# Patient Record
Sex: Female | Born: 1980 | Race: Black or African American | Hispanic: No | Marital: Married | Smoking: Never smoker
Health system: Southern US, Community
[De-identification: ages and names within clinical notes are randomized; demographics above are authoritative.]

## PROBLEM LIST (undated history)

## (undated) DIAGNOSIS — K602 Anal fissure, unspecified: Secondary | ICD-10-CM

## (undated) DIAGNOSIS — K649 Unspecified hemorrhoids: Secondary | ICD-10-CM

## (undated) DIAGNOSIS — I499 Cardiac arrhythmia, unspecified: Secondary | ICD-10-CM

## (undated) HISTORY — PX: NO PAST SURGERIES: SHX2092

## (undated) HISTORY — DX: Cardiac arrhythmia, unspecified: I49.9

## (undated) HISTORY — PX: WISDOM TOOTH EXTRACTION: SHX21

## (undated) HISTORY — PX: POLYPECTOMY: SHX149

---
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
## 2000-04-09 ENCOUNTER — Emergency Department (HOSPITAL_COMMUNITY): Admission: EM | Admit: 2000-04-09 | Discharge: 2000-04-10 | Payer: Self-pay | Admitting: Emergency Medicine

## 2002-07-23 ENCOUNTER — Inpatient Hospital Stay (HOSPITAL_COMMUNITY): Admission: EM | Admit: 2002-07-23 | Discharge: 2002-07-28 | Payer: Self-pay | Admitting: Emergency Medicine

## 2004-01-12 ENCOUNTER — Emergency Department (HOSPITAL_COMMUNITY): Admission: EM | Admit: 2004-01-12 | Discharge: 2004-01-12 | Payer: Self-pay | Admitting: Emergency Medicine

## 2006-01-21 ENCOUNTER — Inpatient Hospital Stay (HOSPITAL_COMMUNITY): Admission: AD | Admit: 2006-01-21 | Discharge: 2006-01-21 | Payer: Self-pay | Admitting: Obstetrics & Gynecology

## 2009-12-06 ENCOUNTER — Emergency Department (HOSPITAL_COMMUNITY): Admission: EM | Admit: 2009-12-06 | Discharge: 2009-12-06 | Payer: Self-pay | Admitting: Emergency Medicine

## 2010-05-26 LAB — URINE MICROSCOPIC-ADD ON

## 2010-05-26 LAB — URINALYSIS, ROUTINE W REFLEX MICROSCOPIC
Bilirubin Urine: NEGATIVE
Hgb urine dipstick: NEGATIVE
Nitrite: NEGATIVE
Specific Gravity, Urine: 1.027 (ref 1.005–1.030)
pH: 7 (ref 5.0–8.0)

## 2010-05-26 LAB — PREGNANCY, URINE: Preg Test, Ur: NEGATIVE

## 2010-05-26 LAB — URINE CULTURE

## 2010-07-29 NOTE — H&P (Signed)
NAME:  Meghan Jackson, Meghan Jackson NO.:  [id number]   MEDICAL RECORD NO.:  [id number]                   PATIENT TYPE:  INP   LOCATION:  1843                                 FACILITY:  [facility]   PHYSICIAN:  Ollen Gross. Vernell Morgans, M.D.              DATE OF BIRTH:  07/19/1980   DATE OF ADMISSION:  07/23/2002  DATE OF DISCHARGE:                                HISTORY & PHYSICAL   CHIEF COMPLAINT:  Rectal pain.   [name] is a 30 year old black female who has a history of anal fissure  about a year ago.  She had been doing well since then, until yesterday when  she developed acute onset of pain and swelling in her perirectal area.  She  noticed this after having a bowel movement.  She does not remember straining  excessively with her bowel movement.  Her bowels seem to move on a regular  basis.   REVIEW OF SYSTEMS:  She denies any nausea, vomiting, fevers, chills, chest  pain, shortness of breath, diarrhea, or dysuria.  The rest of her review of  systems is unremarkable.   PAST MEDICAL HISTORY:  Significant for anal fissure.   PAST SURGICAL HISTORY:  None.   MEDICATIONS:  Include:  Vicodin and penicillin.   ALLERGIES:  NO KNOWN DRUG ALLERGIES.   SOCIAL HISTORY:  She denies use of tobacco or tobacco products.   FAMILY HISTORY:  Noncontributory.   PHYSICAL EXAMINATION:  VITAL SIGNS:  Temperature 100.2.  Blood pressure  119/82.  Pulse 85.  GENERAL:  She is a well-developed well-nourished young bright female in no  acute distress.  SKIN:  Warm and dry with no jaundice.  EYES:  Extraocular muscles are intact.  Pupils equal, round, and reactive to  light.  Sclerae nonicteric.  LUNGS:  Clear bilaterally with no use of accessory respiratory muscles.  HEART: Regular rate and rhythm with impulse in the left chest.  ABDOMEN:  Soft and nontender with no palpable mass or hepatosplenomegaly.  EXTREMITIES:  No clubbing, cyanosis, or edema.  PSYCHOLOGICAL:  Alert and  oriented x 3 with no evidence of anxiety or  depression, but she is very painful and tearful.  RECTAL:  She had circumferentially prolapsed internal hemorrhoids with no  necrosis of the overlying tissue.  These are easily reducible with finger  palpation.  I cannot palpate any mass.   ASSESSMENT/PLAN:  This is a 30 year old black female with prolapsed internal  hemorrhoids that are reducible with no overlying necrosis.  We will plan to  admit her for bedrest and continuous ice packs and pain control.  If we  can improve her swelling, she may be able to go home on stool softeners and  may be a candidate for a hemorrhoidectomy at a later date and possibly a PPH  hemorrhoidectomy.  If she does not improve, then she may require a more  formal hemorrhoidectomy  during this admission.                                               Ollen Gross. Vernell Morgans, M.D.    PST/MEDQ  D:  07/23/2002  T:  07/24/2002  Job:  [id number]

## 2010-07-29 NOTE — Discharge Summary (Signed)
   NAME:  BLIA, TOTMAN NO.:  [id number]   MEDICAL RECORD NO.:  [id number]                   PATIENT TYPE:  INP   LOCATION:  5528                                 FACILITY:  [facility]   PHYSICIAN:  Ollen Gross. Vernell Morgans, M.D.              DATE OF BIRTH:  Sep 22, 1980   DATE OF ADMISSION:  07/23/2002  DATE OF DISCHARGE:  07/28/2002                                 DISCHARGE SUMMARY   HOSPITAL COURSE:  Briefly, [name] is a 30 year old black female who was  admitted with severe circumferential prolapsed internal hemorrhoids with no  evidence of skin necrosis.  She was admitted for pain control and bedrest  and continuous ice packs to rectum.  She made slow progress over the ensuing  several days but by hospital day #5 her hemorrhoids were much smaller, more  reducible.  Her pain was improved and she was ready for discharge home.   DISCHARGE MEDICATIONS:  1. Stools softeners.  2. Pain medicines as needed.   DIET:  High-fiber diet.   ACTIVITY:  No heavy lifting.   DISCHARGE INSTRUCTIONS:  She is to continue the ice packs to her perirectal  area alternating with Tucks pads and she will follow up in one week with Dr.  Carolynne Edouard.   FINAL DIAGNOSIS:  Severe prolapsed internal hemorrhoids and she is  discharged home.                                               Ollen Gross. Vernell Morgans, M.D.    PST/MEDQ  D:  09/02/2002  T:  09/03/2002  Job:  [id number]

## 2011-11-09 ENCOUNTER — Emergency Department (HOSPITAL_COMMUNITY)
Admission: EM | Admit: 2011-11-09 | Discharge: 2011-11-09 | Disposition: A | Discharge status: Home / Self Care | Payer: No Typology Code available for payment source | Attending: Emergency Medicine | Admitting: Emergency Medicine

## 2011-11-09 ENCOUNTER — Encounter (HOSPITAL_COMMUNITY): Payer: Self-pay | Admitting: *Deleted

## 2011-11-09 DIAGNOSIS — K645 Perianal venous thrombosis: Secondary | ICD-10-CM | POA: Insufficient documentation

## 2011-11-09 DIAGNOSIS — K644 Residual hemorrhoidal skin tags: Secondary | ICD-10-CM

## 2011-11-09 HISTORY — DX: Anal fissure, unspecified: K60.2

## 2011-11-09 HISTORY — DX: Unspecified hemorrhoids: K64.9

## 2011-11-09 MED ORDER — HYDROCORTISONE 2.5 % RE CREA: TOPICAL_CREAM | RECTAL | Status: AC

## 2011-11-09 MED ORDER — HYDROCODONE-ACETAMINOPHEN 5-325 MG PO TABS: 1.0000 | ORAL_TABLET | Freq: Four times a day (QID) | ORAL | Status: AC | PRN

## 2011-11-09 MED ORDER — POLYETHYLENE GLYCOL 3350 17 G PO PACK: 17.0000 g | PACK | Freq: Every day | ORAL | Status: AC

## 2011-11-09 MED ORDER — LIDOCAINE 5 % EX OINT
TOPICAL_OINTMENT | Freq: Two times a day (BID) | CUTANEOUS | Status: DC | PRN
  Administered 2011-11-09: 17:00:00 via TOPICAL
  Filled 2011-11-09: qty 35.44

## 2011-11-09 NOTE — ED Notes (Signed)
Pt reports increasing discomfort to hemorrhoids. Pt reports no relief at home with home remedies.

## 2011-11-09 NOTE — ED Notes (Signed)
Lido ordered from pharmacy

## 2011-11-09 NOTE — ED Notes (Signed)
Denies any blood to rectum.

## 2011-11-09 NOTE — ED Provider Notes (Signed)
History     CSN: [id number]  Arrival date & time 11/09/11  1314   First MD Initiated Contact with Patient 11/09/11 1608      Chief Complaint  Patient presents with  . Hemorrhoids    (Consider location/radiation/quality/duration/timing/severity/associated sxs/prior treatment) HPI Comments: Patient with a history of hemorrhoids and anal fissures presents emergency department with chief complaint of hemorrhoid.  She reports current onset of hemorrhoid began approximately a week and half ago and has been gradually worsening.  Pain severity 10/10.  She denies any hematemesis, melena, gross rectal bleeding, purulent drainage, pruritus, fever, night sweats, or chills. Associated s/s include reports increased hemorrhoid size, fluctuance and pain with defecation.  Pt states she has tied epson soaks w no relief.   The history is provided by the patient.    Past Medical History  Diagnosis Date  . Hemorrhoids   . Anal fissure     History reviewed. No pertinent past surgical history.  History reviewed. No pertinent family history.  History  Substance Use Topics  . Smoking status: Never Smoker   . Smokeless tobacco: Not on file  . Alcohol Use: Yes     occ    OB History    Grav Para Term Preterm Abortions TAB SAB Ect Mult Living                  Review of Systems  Constitutional: Negative for fever, diaphoresis and activity change.  HENT: Negative for congestion and neck pain.   Respiratory: Negative for cough.   Gastrointestinal: Positive for rectal pain. Negative for nausea, vomiting, constipation, blood in stool and anal bleeding.  Genitourinary: Negative for dysuria.  Musculoskeletal: Negative for myalgias.  Skin: Negative for color change and wound.  Neurological: Negative for headaches.  All other systems reviewed and are negative.    Allergies  Review of patient's allergies indicates no known allergies.  Home Medications   Current Outpatient Rx  Name Route Sig  Dispense Refill  . ASPIRIN 325 MG PO TABS Oral Take 325 mg by mouth daily as needed. For pain    . PHENYLEPH-SHARK LIV OIL-MO-PET 0.25-3-14-71.9 % RE OINT Rectal Place 1 application rectally 2 (two) times daily as needed. For hemorrhoids    . PE-SHARK LIVER OIL-COCOA BUTTR 0.25-3-85.5 % RE SUPP Rectal Place 1 suppository rectally 4 (four) times daily as needed. For hemorrhoids      BP 106/72  Pulse 86  Temp 97.9 F (36.6 C) (Oral)  Resp 16  SpO2 98%  Physical Exam  Nursing note and vitals reviewed. Constitutional: She is oriented to person, place, and time. She appears well-developed and well-nourished. No distress.  HENT:  Head: Normocephalic and atraumatic.  Eyes: Conjunctivae and EOM are normal.  Neck: Normal range of motion.  Pulmonary/Chest: Effort normal.  Genitourinary:       Chaperone was present. Patient with significant pain around the rectal area. There are no external fissures noted. 3 large external hemorrhoids seen. 2 appear thrombosed and 1 fluctuant, likely prolapsed.  There is no gross blood.      Musculoskeletal: Normal range of motion.  Neurological: She is alert and oriented to person, place, and time.  Skin: Skin is warm and dry. No rash noted. She is not diaphoretic.  Psychiatric: She has a normal mood and affect. Her behavior is normal.    ED Course  Procedures (including critical care time)  Labs Reviewed - No data to display No results found.   No diagnosis found.  MDM  Hemorrhoids  Pt to ER e external hemorrhoids. Home care discussed including sitz baths 15 min TID. Dc w lidocaine ointment, Anusol, stool softener and recommendation for surgery f-u. Pt w normal VS and in NAD prior to dc.         Jaci Carrel, [place] 11/09/11 1639

## 2011-11-09 NOTE — ED Provider Notes (Signed)
Medical screening examination/treatment/procedure(s) were performed by non-physician practitioner and as supervising physician I was immediately available for consultation/collaboration. Devoria Albe, MD, Armando Gang   Ward Givens, MD 11/09/11 2016

## 2011-11-27 ENCOUNTER — Encounter (INDEPENDENT_AMBULATORY_CARE_PROVIDER_SITE_OTHER): Payer: Self-pay | Admitting: General Surgery

## 2011-12-14 ENCOUNTER — Encounter (INDEPENDENT_AMBULATORY_CARE_PROVIDER_SITE_OTHER): Payer: Self-pay | Admitting: General Surgery

## 2012-01-09 ENCOUNTER — Encounter (INDEPENDENT_AMBULATORY_CARE_PROVIDER_SITE_OTHER): Payer: Self-pay | Admitting: General Surgery

## 2012-01-22 ENCOUNTER — Encounter (INDEPENDENT_AMBULATORY_CARE_PROVIDER_SITE_OTHER): Payer: Self-pay | Admitting: General Surgery

## 2012-06-06 ENCOUNTER — Other Ambulatory Visit (HOSPITAL_COMMUNITY): Payer: Self-pay | Admitting: Obstetrics & Gynecology

## 2012-06-06 DIAGNOSIS — N979 Female infertility, unspecified: Secondary | ICD-10-CM

## 2012-06-13 ENCOUNTER — Ambulatory Visit (HOSPITAL_COMMUNITY)
Admission: RE | Admit: 2012-06-13 | Discharge: 2012-06-13 | Disposition: A | Discharge status: Home / Self Care | Payer: BC Managed Care – PPO | Source: Ambulatory Visit | Attending: Obstetrics & Gynecology | Admitting: Obstetrics & Gynecology

## 2012-06-13 DIAGNOSIS — N979 Female infertility, unspecified: Secondary | ICD-10-CM | POA: Insufficient documentation

## 2012-06-13 MED ORDER — IOHEXOL 300 MG/ML  SOLN
20.0000 mL | Freq: Once | INTRAMUSCULAR | Status: AC | PRN
  Administered 2012-06-13: 5 mL

## 2013-02-21 ENCOUNTER — Inpatient Hospital Stay (HOSPITAL_COMMUNITY)
Admission: EM | Admit: 2013-02-21 | Discharge: 2013-02-24 | DRG: 872 | Disposition: A | Discharge status: Home / Self Care | Payer: BC Managed Care – PPO | Attending: Internal Medicine | Admitting: Internal Medicine

## 2013-02-21 ENCOUNTER — Encounter (HOSPITAL_COMMUNITY): Payer: Self-pay | Admitting: Emergency Medicine

## 2013-02-21 DIAGNOSIS — L0231 Cutaneous abscess of buttock: Secondary | ICD-10-CM | POA: Diagnosis present

## 2013-02-21 DIAGNOSIS — I959 Hypotension, unspecified: Secondary | ICD-10-CM

## 2013-02-21 DIAGNOSIS — L0501 Pilonidal cyst with abscess: Secondary | ICD-10-CM

## 2013-02-21 DIAGNOSIS — E871 Hypo-osmolality and hyponatremia: Secondary | ICD-10-CM | POA: Diagnosis present

## 2013-02-21 DIAGNOSIS — A419 Sepsis, unspecified organism: Principal | ICD-10-CM | POA: Diagnosis present

## 2013-02-21 DIAGNOSIS — D72829 Elevated white blood cell count, unspecified: Secondary | ICD-10-CM | POA: Diagnosis present

## 2013-02-21 NOTE — ED Notes (Signed)
Pt. reports abscess at buttocks onset Monday this week with no drainage . Fever with chills. Pt. took Ibuprofen prior to arrival .

## 2013-02-22 ENCOUNTER — Emergency Department (HOSPITAL_COMMUNITY): Payer: BC Managed Care – PPO

## 2013-02-22 DIAGNOSIS — L03317 Cellulitis of buttock: Secondary | ICD-10-CM | POA: Diagnosis present

## 2013-02-22 DIAGNOSIS — I959 Hypotension, unspecified: Secondary | ICD-10-CM

## 2013-02-22 DIAGNOSIS — A419 Sepsis, unspecified organism: Principal | ICD-10-CM

## 2013-02-22 DIAGNOSIS — E871 Hypo-osmolality and hyponatremia: Secondary | ICD-10-CM

## 2013-02-22 DIAGNOSIS — D72829 Elevated white blood cell count, unspecified: Secondary | ICD-10-CM

## 2013-02-22 DIAGNOSIS — L0231 Cutaneous abscess of buttock: Secondary | ICD-10-CM

## 2013-02-22 LAB — URINALYSIS, ROUTINE W REFLEX MICROSCOPIC
Bilirubin Urine: NEGATIVE
Glucose, UA: NEGATIVE mg/dL
Ketones, ur: 15 mg/dL — AB
Nitrite: NEGATIVE
Specific Gravity, Urine: 1.029 (ref 1.005–1.030)
pH: 6 (ref 5.0–8.0)

## 2013-02-22 LAB — CBC WITH DIFFERENTIAL/PLATELET
Basophils Relative: 0 % (ref 0–1)
HCT: 37.7 % (ref 36.0–46.0)
Hemoglobin: 12.4 g/dL (ref 12.0–15.0)
Lymphocytes Relative: 22 % (ref 12–46)
Lymphs Abs: 3 10*3/uL (ref 0.7–4.0)
MCHC: 32.9 g/dL (ref 30.0–36.0)
Monocytes Absolute: 1.2 10*3/uL — ABNORMAL HIGH (ref 0.1–1.0)
Monocytes Relative: 8 % (ref 3–12)
Neutro Abs: 9.6 10*3/uL — ABNORMAL HIGH (ref 1.7–7.7)
Neutrophils Relative %: 69 % (ref 43–77)
RBC: 4.36 MIL/uL (ref 3.87–5.11)

## 2013-02-22 LAB — POCT PREGNANCY, URINE: Preg Test, Ur: NEGATIVE

## 2013-02-22 LAB — BASIC METABOLIC PANEL
BUN: 12 mg/dL (ref 6–23)
CO2: 24 mEq/L (ref 19–32)
Chloride: 100 mEq/L (ref 96–112)
Creatinine, Ser: 0.89 mg/dL (ref 0.50–1.10)
GFR calc Af Amer: >90 mL/min (ref >90)
Glucose, Bld: 106 mg/dL — ABNORMAL HIGH (ref 70–99)
Potassium: 4.1 mEq/L (ref 3.5–5.1)

## 2013-02-22 LAB — URINE MICROSCOPIC-ADD ON

## 2013-02-22 MED ORDER — SODIUM CHLORIDE 0.9 % IV BOLUS (SEPSIS)
1000.0000 mL | Freq: Once | INTRAVENOUS | Status: AC
  Administered 2013-02-22: 1000 mL via INTRAVENOUS

## 2013-02-22 MED ORDER — ZOLPIDEM TARTRATE 5 MG PO TABS: 5.0000 mg | ORAL_TABLET | Freq: Every evening | ORAL | Status: DC | PRN

## 2013-02-22 MED ORDER — SODIUM CHLORIDE 0.9 % IV SOLN
1000.0000 mL | INTRAVENOUS | Status: DC
  Administered 2013-02-22: 1000 mL via INTRAVENOUS

## 2013-02-22 MED ORDER — ONDANSETRON HCL 4 MG/2ML IJ SOLN
4.0000 mg | Freq: Once | INTRAMUSCULAR | Status: AC
  Administered 2013-02-22: 4 mg via INTRAVENOUS
  Filled 2013-02-22: qty 2

## 2013-02-22 MED ORDER — HYDROMORPHONE HCL PF 1 MG/ML IJ SOLN: 0.5000 mg | INTRAMUSCULAR | Status: DC | PRN

## 2013-02-22 MED ORDER — SODIUM CHLORIDE 0.9 % IV SOLN
1000.0000 mL | Freq: Once | INTRAVENOUS | Status: AC
  Administered 2013-02-22: 1000 mL via INTRAVENOUS

## 2013-02-22 MED ORDER — MORPHINE SULFATE 4 MG/ML IJ SOLN
4.0000 mg | Freq: Once | INTRAMUSCULAR | Status: AC
  Administered 2013-02-22: 4 mg via INTRAVENOUS
  Filled 2013-02-22: qty 1

## 2013-02-22 MED ORDER — ONDANSETRON HCL 4 MG/2ML IJ SOLN: 4.0000 mg | Freq: Four times a day (QID) | INTRAMUSCULAR | Status: DC | PRN

## 2013-02-22 MED ORDER — PIPERACILLIN-TAZOBACTAM 3.375 G IVPB
3.3750 g | Freq: Once | INTRAVENOUS | Status: AC
Start: 2013-02-22 — End: 2013-02-22
  Administered 2013-02-22: 3.375 g via INTRAVENOUS
  Filled 2013-02-22: qty 50

## 2013-02-22 MED ORDER — OXYCODONE HCL 5 MG PO TABS
5.0000 mg | ORAL_TABLET | ORAL | Status: DC | PRN
  Administered 2013-02-22 – 2013-02-24 (×4): 5 mg via ORAL
  Filled 2013-02-22 (×4): qty 1

## 2013-02-22 MED ORDER — VANCOMYCIN HCL IN DEXTROSE 1-5 GM/200ML-% IV SOLN
1000.0000 mg | Freq: Three times a day (TID) | INTRAVENOUS | Status: DC
  Administered 2013-02-22 – 2013-02-24 (×7): 1000 mg via INTRAVENOUS
  Filled 2013-02-22 (×8): qty 200

## 2013-02-22 MED ORDER — ACETAMINOPHEN 325 MG PO TABS
650.0000 mg | ORAL_TABLET | ORAL | Status: AC
  Administered 2013-02-22: 650 mg via ORAL
  Filled 2013-02-22: qty 2

## 2013-02-22 MED ORDER — ALUM & MAG HYDROXIDE-SIMETH 200-200-20 MG/5ML PO SUSP: 30.0000 mL | Freq: Four times a day (QID) | ORAL | Status: DC | PRN

## 2013-02-22 MED ORDER — ACETAMINOPHEN 325 MG PO TABS: 650.0000 mg | ORAL_TABLET | Freq: Four times a day (QID) | ORAL | Status: DC | PRN

## 2013-02-22 MED ORDER — ENOXAPARIN SODIUM 40 MG/0.4ML ~~LOC~~ SOLN
40.0000 mg | Freq: Every day | SUBCUTANEOUS | Status: DC
  Administered 2013-02-22 – 2013-02-24 (×3): 40 mg via SUBCUTANEOUS
  Filled 2013-02-22 (×3): qty 0.4

## 2013-02-22 MED ORDER — ONDANSETRON HCL 4 MG PO TABS: 4.0000 mg | ORAL_TABLET | Freq: Four times a day (QID) | ORAL | Status: DC | PRN

## 2013-02-22 MED ORDER — VANCOMYCIN HCL IN DEXTROSE 1-5 GM/200ML-% IV SOLN
1000.0000 mg | Freq: Once | INTRAVENOUS | Status: AC
  Administered 2013-02-22: 1000 mg via INTRAVENOUS
  Filled 2013-02-22: qty 200

## 2013-02-22 MED ORDER — ACETAMINOPHEN 650 MG RE SUPP: 650.0000 mg | Freq: Four times a day (QID) | RECTAL | Status: DC | PRN

## 2013-02-22 MED ORDER — SODIUM CHLORIDE 0.9 % IV SOLN
INTRAVENOUS | Status: DC
  Administered 2013-02-22 – 2013-02-23 (×4): via INTRAVENOUS

## 2013-02-22 MED ORDER — PIPERACILLIN-TAZOBACTAM 3.375 G IVPB
3.3750 g | Freq: Three times a day (TID) | INTRAVENOUS | Status: DC
  Administered 2013-02-22 – 2013-02-24 (×6): 3.375 g via INTRAVENOUS
  Filled 2013-02-22 (×9): qty 50

## 2013-02-22 NOTE — ED Notes (Signed)
Pt reports boil inside right buttock since Monday. Reports fever, chills, pain. Took ibuprofen yesterday for fever.

## 2013-02-22 NOTE — Progress Notes (Signed)
New Admission Note: Late Entry   Arrival Method: Via stretcher from the ED with EMT Mental Orientation: Alert and oriented Telemetry: Box 15 Assessment: Completed Skin: Right buttock abscess, packed with iodoform (ED MD) IV: Clean, dry and intact. Infusing NSL and ABT Pain: Patient stated the she is comfortable at the moment  Tubes: None Safety Measures: Safety Fall Prevention Plan has been given, discussed and signed Admission: Completed [address] Orientation: Patient has been orientated to the room, unit and staff.  Family: Husband at the bedside  Orders have been reviewed and implemented. Will continue to monitor the patient. Call light has been placed within reach and bed alarm has been activated.   [employer] BSN, RN  Phone number: [phone]

## 2013-02-22 NOTE — ED Provider Notes (Signed)
CSN: [id number]     Arrival date & time 02/21/13  2119 History   First MD Initiated Contact with Patient 02/22/13 0018     Chief Complaint  Patient presents with  . Abscess   HPI  History provided by the patient. Patient is a 32 year old female who presents with complaints of boil to her sacral area with pain and swelling. Patient reports first having a small boil she felt on Monday. She did not have any bleeding or drainage from the area. She does report having past history of similar symptoms but states after a few days of warm compresses these usually resolve on their own. She has no prior history of requiring I&D. Her symptoms however worsened through the week. Yesterday patient began to also have symptoms of fever and chills. She states she was trying to hold off having further evaluation until the day but symptoms became much worse tonight and she came for further evaluation and treatment. Patient did take ibuprofen prior to arrival which only helps slightly with her pain but did seem to help with her fever symptoms. She denies having any other symptoms recently. No cough or congestion symptoms. No urinary complaints. No other aggravating or alleviating factors. No other associated symptoms.     Past Medical History  Diagnosis Date  . Hemorrhoids   . Anal fissure    History reviewed. No pertinent past surgical history. No family history on file. History  Substance Use Topics  . Smoking status: Never Smoker   . Smokeless tobacco: Not on file  . Alcohol Use: Yes     Comment: occ   OB History   Grav Para Term Preterm Abortions TAB SAB Ect Mult Living                 Review of Systems  Constitutional: Positive for fever and chills.  Respiratory: Negative for cough and shortness of breath.   Cardiovascular: Negative for chest pain.  Gastrointestinal: Negative for nausea, vomiting, abdominal pain, diarrhea and constipation.  Genitourinary: Negative for dysuria, frequency,  hematuria and flank pain.  All other systems reviewed and are negative.    Allergies  Latex  Home Medications   Current Outpatient Rx  Name  Route  Sig  Dispense  Refill  . ibuprofen (ADVIL,MOTRIN) 200 MG tablet   Oral   Take 400 mg by mouth every 6 (six) hours as needed.          BP 94/52  Pulse 104  Temp(Src) 99.7 F (37.6 C) (Oral)  Resp 14  Ht 5\' 4"  (1.626 m)  Wt 176 lb (79.833 kg)  BMI 30.20 kg/m2  SpO2 100% Physical Exam  Nursing note and vitals reviewed. Constitutional: She is oriented to person, place, and time. She appears well-developed and well-nourished. No distress.  HENT:  Head: Normocephalic.  Neck: Normal range of motion. Neck supple.  No meningeal signs  Cardiovascular: Regular rhythm.  Tachycardia present.   Pulmonary/Chest: Effort normal and breath sounds normal. No respiratory distress. She has no wheezes. She has no rales.  Abdominal: Soft. There is no tenderness. There is no rebound and no guarding.  Genitourinary:  Large area of erythema and induration to the pilonidal area extending into the left gluteus. Area sniffily tender to palpation. No pointing or head. No bleeding or drainage.  Musculoskeletal: Normal range of motion. She exhibits no edema and no tenderness.  Neurological: She is alert and oriented to person, place, and time.  Skin: Skin is warm. No rash noted.  She is diaphoretic.  Psychiatric: She has a normal mood and affect. Her behavior is normal.    ED Course  Procedures   DIAGNOSTIC STUDIES: Oxygen Saturation is 100% on room air.  COORDINATION OF CARE:  Nursing notes reviewed. Vital signs reviewed. Initial pt interview and examination performed.   1:15AM Discussed work up plan with pt at bedside, which includes labs, blood cultures and I&D. Pt agrees with plan.  1:50AM Pt now developing hypotension.  80/45 despite start of iv fluids.  Code sepsis called.  Vanc and zosyn ordered.  Pt discussed with attending who  agrees.   5:40AM Spoke with Dr. Lovell Sheehan with [facility].  She will see pt and admit to tele bed team 10.   INCISION AND DRAINAGE Performed by: Angus Seller Consent: Verbal consent obtained. Risks and benefits: risks, benefits and alternatives were discussed Type: abscess  Body area: Pilonidal  Anesthesia: local infiltration  Incision was made with a scalpel.  Local anesthetic: lidocaine 2% with epinephrine  Anesthetic total: 8 ml  Complexity: complex Blunt dissection to break up loculations  Drainage: purulent  Drainage amount: Large   Packing material: 1/4 in iodoform gauze  Patient tolerance: Patient tolerated the procedure well with no immediate complications.      Treatment plan initiated: Medications  sodium chloride 0.9 % bolus 1,000 mL (not administered)  vancomycin (VANCOCIN) IVPB 1000 mg/200 mL premix (not administered)  piperacillin-tazobactam (ZOSYN) IVPB 3.375 g (not administered)  0.9 %  sodium chloride infusion (not administered)    Followed by  0.9 %  sodium chloride infusion (not administered)    Followed by  0.9 %  sodium chloride infusion (not administered)  acetaminophen (TYLENOL) tablet 650 mg (650 mg Oral Given 02/22/13 0017)  sodium chloride 0.9 % bolus 1,000 mL (1,000 mLs Intravenous New Bag/Given 02/22/13 0125)     Results for orders placed during the hospital encounter of 02/21/13  CBC WITH DIFFERENTIAL      Result Value Range   WBC 13.9 (*) 4.0 - 10.5 K/uL   RBC 4.36  3.87 - 5.11 MIL/uL   Hemoglobin 12.4  12.0 - 15.0 g/dL   HCT 47.8  29.5 - 62.1 %   MCV 86.5  78.0 - 100.0 fL   MCH 28.4  26.0 - 34.0 pg   MCHC 32.9  30.0 - 36.0 g/dL   RDW 30.8  65.7 - 84.6 %   Platelets 232  150 - 400 K/uL   Neutrophils Relative % 69  43 - 77 %   Neutro Abs 9.6 (*) 1.7 - 7.7 K/uL   Lymphocytes Relative 22  12 - 46 %   Lymphs Abs 3.0  0.7 - 4.0 K/uL   Monocytes Relative 8  3 - 12 %   Monocytes Absolute 1.2 (*) 0.1 - 1.0 K/uL    Eosinophils Relative 1  0 - 5 %   Eosinophils Absolute 0.1  0.0 - 0.7 K/uL   Basophils Relative 0  0 - 1 %   Basophils Absolute 0.0  0.0 - 0.1 K/uL  BASIC METABOLIC PANEL      Result Value Range   Sodium 132 (*) 135 - 145 mEq/L   Potassium 4.1  3.5 - 5.1 mEq/L   Chloride 100  96 - 112 mEq/L   CO2 24  19 - 32 mEq/L   Glucose, Bld 106 (*) 70 - 99 mg/dL   BUN 12  6 - 23 mg/dL   Creatinine, Ser 9.62  0.50 - 1.10 mg/dL  Calcium 9.1  8.4 - 10.5 mg/dL   GFR calc non Af Amer 84 (*) >90 mL/min   GFR calc Af Amer >90  >90 mL/min  URINALYSIS, ROUTINE W REFLEX MICROSCOPIC      Result Value Range   Color, Urine YELLOW  YELLOW   APPearance CLEAR  CLEAR   Specific Gravity, Urine 1.029  1.005 - 1.030   pH 6.0  5.0 - 8.0   Glucose, UA NEGATIVE  NEGATIVE mg/dL   Hgb urine dipstick TRACE (*) NEGATIVE   Bilirubin Urine NEGATIVE  NEGATIVE   Ketones, ur 15 (*) NEGATIVE mg/dL   Protein, ur NEGATIVE  NEGATIVE mg/dL   Urobilinogen, UA 1.0  0.0 - 1.0 mg/dL   Nitrite NEGATIVE  NEGATIVE   Leukocytes, UA NEGATIVE  NEGATIVE  HCG, QUANTITATIVE, PREGNANCY      Result Value Range   hCG, Beta Chain, Quant, S <1  <5 mIU/mL  URINE MICROSCOPIC-ADD ON      Result Value Range   Squamous Epithelial / LPF FEW (*) RARE   RBC / HPF 3-6  <3 RBC/hpf   Bacteria, UA RARE  RARE  CG4 I-STAT (LACTIC ACID)      Result Value Range   Lactic Acid, Venous 0.85  0.5 - 2.2 mmol/L  POCT PREGNANCY, URINE      Result Value Range   Preg Test, Ur NEGATIVE  NEGATIVE   Dg Chest Portable 1 View  02/22/2013   CLINICAL DATA:  Abscess  EXAM: PORTABLE CHEST - 1 VIEW  COMPARISON:  None.  FINDINGS: The heart size and mediastinal contours are within normal limits. Both lungs are clear. The visualized skeletal structures are unremarkable.  IMPRESSION: No acute cardiopulmonary process.   Electronically Signed   By: Rise Mu M.D.   On: 02/22/2013 03:39       MDM   1. Sepsis   2. Pilonidal abscess        Angus Seller, PA-C 02/22/13 [phone]

## 2013-02-22 NOTE — H&P (Signed)
[facility] History and Physical  MISCHELLE REEG ZOX:[id number] DOB: 05-31-1980 DOA: 02/21/2013  Referring physician: EDP PCP: Robley Fries, MD  Specialists:   Chief Complaint: Fever   HPI: Meghan Jackson is a 32 y.o. female who presents to the ED with complaints of pain and increased redness of the left buttock area x 5 days and development of fevers and chills over the past  24 hours.   She was seen and evaluated in the ED and was found to have a Left Buttock /Pilonidal Abscess and an I+D was performed.  Blood Cultures and a Wound Culture were sent and she was placed on IV Vancomycin and Zosyn.  She had hypotension with blood pressures in the 80's which responded to administration of IVFs,  She was referred for medical admission.       Review of Systems: The patient denies anorexia, headaches, weight loss, vision loss, diplopia, dizziness, decreased hearing, rhinitis, hoarseness, chest pain, syncope, dyspnea on exertion, peripheral edema, balance deficits, cough, hemoptysis, abdominal pain, nausea, vomiting, diarrhea, constipation, hematemesis, melena, hematochezia, severe indigestion/heartburn, dysuria, hematuria, incontinence, muscle weakness, transient blindness, difficulty walking, depression, unusual weight change, abnormal bleeding, enlarged lymph nodes, angioedema, and breast masses.    Past Medical History  Diagnosis Date  . Hemorrhoids   . Anal fissure     History reviewed. No pertinent past surgical history.      Prior to Admission medications   Medication Sig Start Date End Date Taking? Authorizing Provider  ibuprofen (ADVIL,MOTRIN) 200 MG tablet Take 400 mg by mouth every 6 (six) hours as needed.   Yes Historical Provider, MD    Allergies  Allergen Reactions  . Latex Itching and Other (See Comments)    dryness    Social History:  reports that she has never smoked. She does not have any smokeless tobacco history on file. She reports that she drinks  alcohol. Her drug history is not on file.     Family History:     Maternal Aunt with Cancer (Unknown type)   Physical Exam:  GEN:  Pleasant Obese 32 y.o. African American female  examined  and in no acute distress; cooperative with exam Filed Vitals:   02/22/13 0530 02/22/13 0545 02/22/13 0600 02/22/13 0615  BP: 102/59 104/45 103/54 102/60  Pulse:   103 108  Temp:      TempSrc:      Resp:   18   Height:      Weight:      SpO2:   100% 100%   Blood pressure 102/60, pulse 108, temperature 99.7 F (37.6 C), temperature source Oral, resp. rate 18, height 5\' 4"  (1.626 m), weight 79.833 kg (176 lb), SpO2 100.00%. PSYCH: SHe is alert and oriented x4; does not appear anxious does not appear depressed; affect is normal HEENT: Normocephalic and Atraumatic, Mucous membranes pink; PERRLA; EOM intact; Fundi:  Benign;  No scleral icterus, Nares: Patent, Oropharynx: Clear, Fair Dentition, Neck:  FROM, no cervical lymphadenopathy nor thyromegaly or carotid bruit; no JVD; Breasts:: Not examined CHEST WALL: No tenderness CHEST: Normal respiration, clear to auscultation bilaterally HEART: Regular rate and rhythm; no murmurs rubs or gallops BACK: No kyphosis or scoliosis; no CVA tenderness ABDOMEN: Positive Bowel Sounds, Obese, soft non-tender; no masses, no organomegaly, no pannus; no intertriginous candida. Rectal Exam: Not done EXTREMITIES: No cyanosis, clubbing or edema; no ulcerations. Genitalia: not examined PULSES: 2+ and symmetric SKIN: Normal hydration no rash or ulceration CNS: Cranial nerves 2-12 grossly intact no focal  neurologic deficit    Labs on Admission:  Basic Metabolic Panel:  Recent Labs Lab 02/22/13 0029  NA 132*  K 4.1  CL 100  CO2 24  GLUCOSE 106*  BUN 12  CREATININE 0.89  CALCIUM 9.1   Liver Function Tests: No results found for this basename: AST, ALT, ALKPHOS, BILITOT, PROT, ALBUMIN,  in the last 168 hours No results found for this basename: LIPASE,  AMYLASE,  in the last 168 hours No results found for this basename: AMMONIA,  in the last 168 hours CBC:  Recent Labs Lab 02/22/13 0029  WBC 13.9*  NEUTROABS 9.6*  HGB 12.4  HCT 37.7  MCV 86.5  PLT 232   Cardiac Enzymes: No results found for this basename: CKTOTAL, CKMB, CKMBINDEX, TROPONINI,  in the last 168 hours  BNP (last 3 results) No results found for this basename: PROBNP,  in the last 8760 hours CBG: No results found for this basename: GLUCAP,  in the last 168 hours  Radiological Exams on Admission: Dg Chest Portable 1 View  02/22/2013   CLINICAL DATA:  Abscess  EXAM: PORTABLE CHEST - 1 VIEW  COMPARISON:  None.  FINDINGS: The heart size and mediastinal contours are within normal limits. Both lungs are clear. The visualized skeletal structures are unremarkable.  IMPRESSION: No acute cardiopulmonary process.   Electronically Signed   By: Rise Mu M.D.   On: 02/22/2013 03:39       Assessment/Plan Principal Problem:   Sepsis Active Problems:   Cellulitis and abscess of buttock   Hypotension   Hyponatremia   Leukocytosis     1.  Sepsis- due to Cellulitis-  Blood Cultures sent, and placed onIV Vancomycin and Zosyn, and IVFs.    2.  Cellulitis of Left Buttock- S/P I+D and packing of wound.  Wound care daily and PRN.    3.  Hypotension due to #1-  IVFs for Fluid Resuscitation, and IV ABx.    4.  Hyponatremia-  IVFs with NSS, monitor trend of Na+ levels  5.  Leukocytosis- due to #1 and #2, Monitor Trend.     6.  DVT prophylaxis with Lovenox.        Code Status:  FULL CODE  Family Communication:    Husband at Bedside Disposition Plan:       Inpatient  Time spent:  47 Minutes  Ron Parker [facility] Pager [phone]  If 7PM-7AM, please contact night-coverage [email or web address] Password [facility] 02/22/2013, 6:37 AM

## 2013-02-22 NOTE — Progress Notes (Signed)
ANTIBIOTIC CONSULT NOTE - INITIAL  Pharmacy Consult for Vancomycin Indication: rule out sepsis/cellulitis of left buttock  Allergies  Allergen Reactions  . Latex Itching and Other (See Comments)    dryness    Patient Measurements: Height: 5\' 4"  (162.6 cm) Weight: 178 lb 1 oz (80.769 kg) IBW/kg (Calculated) : 54.7 Adjusted Body Weight: 65.1 kg  Vital Signs: Temp: 99.1 F (37.3 C) (12/13 0701) Temp src: Oral (12/13 0701) BP: 118/86 mmHg (12/13 0701) Pulse Rate: 100 (12/13 0701)  Labs:  Recent Labs  02/22/13 0029  WBC 13.9*  HGB 12.4  PLT 232  CREATININE 0.89   Estimated Creatinine Clearance: 92.4 ml/min (by C-G formula based on Cr of 0.89). No results found for this basename: VANCOTROUGH, VANCOPEAK, VANCORANDOM, GENTTROUGH, GENTPEAK, GENTRANDOM, TOBRATROUGH, TOBRAPEAK, TOBRARND, AMIKACINPEAK, AMIKACINTROU, AMIKACIN,  in the last 72 hours   Microbiology: No results found for this or any previous visit (from the past 720 hour(s)).  Medical History: Past Medical History  Diagnosis Date  . Hemorrhoids   . Anal fissure     Medications:  Prescriptions prior to admission  Medication Sig Dispense Refill  . ibuprofen (ADVIL,MOTRIN) 200 MG tablet Take 400 mg by mouth every 6 (six) hours as needed.       Assessment: 31 yo F presenting to ED on 12/13 with pain and increased redness of left buttock area x 5 days with recent development of fever, chills and hypotension. Now s/p I&D of left buttock/pilonidal abscess. Started on Vanc + Zosyn for possible sepsis. Tmax 101, WBC 13.9, SCr wnl  12/13 Vanc> 12/13 Zosyn>  12/13 Wound Cx> 12/13 UCx> 12/13 BCx2>  Goal of Therapy:  Vancomycin trough level 15-20 mcg/ml  Plan:  - Continue Zosyn  - Continue Vancomycin at 1000 mg IV q8h  - F/u VT at [facility] and adjust goal if blood cx negative - Monitor temp, WBC, renal function, clinical status

## 2013-02-22 NOTE — ED Provider Notes (Signed)
Medical screening examination/treatment/procedure(s) were performed by non-physician practitioner and as supervising physician I was immediately available for consultation/collaboration.  EKG Interpretation    Date/Time:    Ventricular Rate:    PR Interval:    QRS Duration:   QT Interval:    QTC Calculation:   R Axis:     Text Interpretation:                Darlys Gales, MD 02/22/13 1213

## 2013-02-22 NOTE — Progress Notes (Signed)
[facility] PROGRESS NOTE  Meghan Jackson ZOX:[id number] DOB: [birth year] DOA: 02/21/2013 PCP: Robley Fries, MD  Brief history 32 year old female with chronic medical problems presents with 5 day history of increasing erythema, edema, and pain of her buttock.  Patient states that it started as a boil the gradually got worse.  Pt states she has a hx of boils in the past that come and go, but they usually drain and resolve spontaneously.  She began having fevers and chills at home for 24 hrs which prompted her to come to the ED.  She was found to have buttock abscess which was drained in the  ED.  She was hypotensive in ED with SBP in 80s.  IVF were started with improvement.  She was started on empiric vancomycin and zosyn.   Assessment/Plan: Sepsis -present at the time of admission -secondary to abscess -continue IVF -UA without pyuria Gluteal abscess -continue empiric vancomycin and zosyn pending culture data -local wound care -pain control Hyponatremia -likely volume depletion -continue IVF   Family Communication:   husband at beside updated Disposition Plan:   Home when medically stable      Procedures/Studies: Dg Chest Portable 1 View  02/22/2013   CLINICAL DATA:  Abscess  EXAM: PORTABLE CHEST - 1 VIEW  COMPARISON:  None.  FINDINGS: The heart size and mediastinal contours are within normal limits. Both lungs are clear. The visualized skeletal structures are unremarkable.  IMPRESSION: No acute cardiopulmonary process.   Electronically Signed   By: Rise Mu M.D.   On: 02/22/2013 03:39         Subjective: Pt c/o pain in buttock.  Denies cp, sob, n/v/d, abdominal pain, dysuria.  Having f/c.  Objective: Filed Vitals:   02/22/13 0615 02/22/13 0630 02/22/13 0701 02/22/13 0931  BP: 102/60 102/53 118/86 93/50  Pulse: 108 105 100 96  Temp:   99.1 F (37.3 C) 99.1 F (37.3 C)  TempSrc:   Oral   Resp:   18 18  Height:   5\' 4"  (1.626 m)   Weight:    80.769 kg (178 lb 1 oz)   SpO2: 100% 100% 99% 96%    Intake/Output Summary (Last 24 hours) at 02/22/13 1110 Last data filed at 02/22/13 0931  Gross per 24 hour  Intake    120 ml  Output      0 ml  Net    120 ml   Weight change:  Exam:   General:  Pt is alert, follows commands appropriately, not in acute distress  HEENT: No icterus, No thrush,  [place]/AT  Cardiovascular: RRR, S1/S2, no rubs, no gallops  Respiratory: CTA bilaterally, no wheezing, no crackles, no rhonchi  Abdomen: Soft/+BS, non tender, non distended, no guarding  Extremities: trace LE edema, No lymphangitis, No petechiae, No rashes, no synovitis.  Incision superior portion of gluteal fold with surrouding induration and erythema--no crepitance, no necrosis.  Data Reviewed: Basic Metabolic Panel:  Recent Labs Lab 02/22/13 0029  NA 132*  K 4.1  CL 100  CO2 24  GLUCOSE 106*  BUN 12  CREATININE 0.89  CALCIUM 9.1   Liver Function Tests: No results found for this basename: AST, ALT, ALKPHOS, BILITOT, PROT, ALBUMIN,  in the last 168 hours No results found for this basename: LIPASE, AMYLASE,  in the last 168 hours No results found for this basename: AMMONIA,  in the last 168 hours CBC:  Recent Labs Lab 02/22/13 0029  WBC 13.9*  NEUTROABS 9.6*  HGB 12.4  HCT 37.7  MCV 86.5  PLT 232   Cardiac Enzymes: No results found for this basename: CKTOTAL, CKMB, CKMBINDEX, TROPONINI,  in the last 168 hours BNP: No components found with this basename: POCBNP,  CBG: No results found for this basename: GLUCAP,  in the last 168 hours  No results found for this or any previous visit (from the past 240 hour(s)).   Scheduled Meds: . enoxaparin (LOVENOX) injection  40 mg Subcutaneous Daily  . piperacillin-tazobactam (ZOSYN)  IV  3.375 g Intravenous Q8H  . vancomycin  1,000 mg Intravenous Q8H   Continuous Infusions: . sodium chloride 1,000 mL (02/22/13 0559)  . sodium chloride 125 mL/hr at 02/22/13 1610      Telena Peyser, DO  [facility] Pager [phone]  If 7PM-7AM, please contact night-coverage [email or web address] Password [email or web address] 02/22/2013, 11:10 AM   LOS: 1 day

## 2013-02-23 LAB — URINE CULTURE

## 2013-02-23 LAB — VANCOMYCIN, TROUGH: Vancomycin Tr: 15.3 ug/mL (ref 10.0–20.0)

## 2013-02-23 LAB — CBC
MCH: 28.5 pg (ref 26.0–34.0)
MCHC: 32.9 g/dL (ref 30.0–36.0)
Platelets: 207 10*3/uL (ref 150–400)
RBC: 3.9 MIL/uL (ref 3.87–5.11)
RDW: 12.4 % (ref 11.5–15.5)

## 2013-02-23 LAB — BASIC METABOLIC PANEL
BUN: 8 mg/dL (ref 6–23)
Calcium: 8.7 mg/dL (ref 8.4–10.5)
GFR calc Af Amer: >90 mL/min (ref >90)
GFR calc non Af Amer: 83 mL/min — ABNORMAL LOW (ref >90)
Glucose, Bld: 93 mg/dL (ref 70–99)
Sodium: 137 mEq/L (ref 135–145)

## 2013-02-23 NOTE — Progress Notes (Signed)
[facility] PROGRESS NOTE  Meghan Jackson WUX:[id number] DOB: [birth year] DOA: 02/21/2013 PCP: Meghan Fries, MD  Assessment/Plan: Sepsis  -present at the time of admission  -secondary to abscess  -continue IVF  -UA without pyuria  -Blood pressure and tachycardia have improved -Blood cultures remain negative Gluteal abscess  -continue empiric vancomycin and zosyn pending culture data  -local wound care  -pain control  -Induration and erythema are improving Hyponatremia  -likely volume depletion  -Improving with IVF    Family Communication:   husband at beside Disposition Plan:   Home when medically stable   Antibiotics:  Vancomycin 02/22/2013>>>  Zosyn 02/22/2013>>>    Procedures/Studies: Dg Chest Portable 1 View  02/22/2013   CLINICAL DATA:  Abscess  EXAM: PORTABLE CHEST - 1 VIEW  COMPARISON:  None.  FINDINGS: The heart size and mediastinal contours are within normal limits. Both lungs are clear. The visualized skeletal structures are unremarkable.  IMPRESSION: No acute cardiopulmonary process.   Electronically Signed   By: Meghan Jackson M.D.   On: 02/22/2013 03:39         Subjective: Patient states that gluteal wound is improving. Pain is improving. She is able to sit down. Denies fevers, chills, chest discomfort, shortness of breath, nausea, vomiting, diarrhea, abdominal pain, dysuria, hematuria. She is tolerating her diet.  Objective: Filed Vitals:   02/22/13 1618 02/22/13 1942 02/23/13 0431 02/23/13 0926  BP: 100/59 104/62 102/63 98/56  Pulse: 87 70 83 81  Temp: 98.3 F (36.8 C) 98.7 F (37.1 C) 99.1 F (37.3 C) 98.2 F (36.8 C)  TempSrc:  Oral Oral Oral  Resp: 18 18 18    Height:  5\' 4"  (1.626 m)    Weight:  79.47 kg (175 lb 3.2 oz)    SpO2: 98% 100% 96%     Intake/Output Summary (Last 24 hours) at 02/23/13 1028 Last data filed at 02/23/13 0700  Gross per 24 hour  Intake 3814.17 ml  Output      0 ml  Net 3814.17 ml   Weight  change: 0.936 kg (2 lb 1 oz) Exam:   General:  Pt is alert, follows commands appropriately, not in acute distress  HEENT: No icterus, No thrush,  [place]/AT  Cardiovascular: RRR, S1/S2, no rubs, no gallops  Respiratory: CTA bilaterally, no wheezing, no crackles, no rhonchi  Abdomen: Soft/+BS, non tender, non distended, no guarding Extremities: No edema, No lymphangitis, No petechiae, No rashes, no synovitis;Incision superior portion of gluteal fold with minimal surrouding induration and no erythema--no crepitance, no necrosis.  Scant serosanguineous drainage    Data Reviewed: Basic Metabolic Panel:  Recent Labs Lab 02/22/13 0029 02/23/13 0430  NA 132* 137  K 4.1 4.1  CL 100 106  CO2 24 25  GLUCOSE 106* 93  BUN 12 8  CREATININE 0.89 0.90  CALCIUM 9.1 8.7   Liver Function Tests: No results found for this basename: AST, ALT, ALKPHOS, BILITOT, PROT, ALBUMIN,  in the last 168 hours No results found for this basename: LIPASE, AMYLASE,  in the last 168 hours No results found for this basename: AMMONIA,  in the last 168 hours CBC:  Recent Labs Lab 02/22/13 0029 02/23/13 0430  WBC 13.9* 10.9*  NEUTROABS 9.6*  --   HGB 12.4 11.1*  HCT 37.7 33.7*  MCV 86.5 86.4  PLT 232 207   Cardiac Enzymes: No results found for this basename: CKTOTAL, CKMB, CKMBINDEX, TROPONINI,  in the last 168 hours BNP: No components found with this basename: POCBNP,  CBG: No results found for this basename: GLUCAP,  in the last 168 hours  Recent Results (from the past 240 hour(s))  URINE CULTURE     Status: None   Collection Time    02/22/13  2:34 AM      Result Value Range Status   Specimen Description URINE, RANDOM   Final   Special Requests NONE   Final   Culture  Setup Time     Final   Value: 02/22/2013 11:39     Performed at [facility] Count     Final   Value: 20,OOO COLONIES/ML     Performed at [facility]   Culture     Final   Value: Multiple bacterial  morphotypes present, none predominant. Suggest appropriate recollection if clinically indicated.     Performed at [facility]   Report Status 02/23/2013 FINAL   Final  WOUND CULTURE     Status: None   Collection Time    02/22/13  6:20 AM      Result Value Range Status   Specimen Description WOUND   Final   Special Requests NONE   Final   Gram Stain PENDING   Incomplete   Culture     Final   Value: FEW ESCHERICHIA COLI     Performed at [facility]   Report Status PENDING   Incomplete     Scheduled Meds: . enoxaparin (LOVENOX) injection  40 mg Subcutaneous Daily  . piperacillin-tazobactam (ZOSYN)  IV  3.375 g Intravenous Q8H  . vancomycin  1,000 mg Intravenous Q8H   Continuous Infusions: . sodium chloride 1,000 mL (02/22/13 0559)  . sodium chloride 125 mL/hr at 02/23/13 1610     Meghan Frenkel, DO  [facility] Pager [phone]  If 7PM-7AM, please contact night-coverage [email or web address] Password [email or web address] 02/23/2013, 10:28 AM   LOS: 2 days

## 2013-02-23 NOTE — Progress Notes (Signed)
CRITICAL VALUE ALERT  Critical value received:  BOTTLES DRAWN AEROBIC AND ANAEROBIC --gram positive cocci in cluster   Date of notification:  02/23/13  Time of notification:  11:53  Critical value read back: yes  Nurse who received alert:  Barnett Applebaum, RN BC, BSN, MSN  MD notified (1st page):  Tat  Time of first page:  11:58  MD notified (2nd page):  Time of second page:  Responding MD:  Tat  Time MD responded:  11:59

## 2013-02-24 LAB — CBC
MCH: 28 pg (ref 26.0–34.0)
MCV: 86.3 fL (ref 78.0–100.0)
Platelets: 229 10*3/uL (ref 150–400)
RDW: 12.3 % (ref 11.5–15.5)
WBC: 7.6 10*3/uL (ref 4.0–10.5)

## 2013-02-24 LAB — BASIC METABOLIC PANEL
Calcium: 8.9 mg/dL (ref 8.4–10.5)
Creatinine, Ser: 0.99 mg/dL (ref 0.50–1.10)
GFR calc Af Amer: 86 mL/min — ABNORMAL LOW (ref >90)
GFR calc non Af Amer: 74 mL/min — ABNORMAL LOW (ref >90)
Glucose, Bld: 95 mg/dL (ref 70–99)
Potassium: 4 mEq/L (ref 3.5–5.1)
Sodium: 139 mEq/L (ref 135–145)

## 2013-02-24 LAB — WOUND CULTURE

## 2013-02-24 LAB — CULTURE, BLOOD (ROUTINE X 2)

## 2013-02-24 MED ORDER — SULFAMETHOXAZOLE-TMP DS 800-160 MG PO TABS
2.0000 | ORAL_TABLET | Freq: Two times a day (BID) | ORAL | Status: DC
  Administered 2013-02-24: 2 via ORAL
  Filled 2013-02-24 (×2): qty 2

## 2013-02-24 MED ORDER — UNABLE TO FIND: Status: DC

## 2013-02-24 MED ORDER — OXYCODONE HCL 5 MG PO TABS: 5.0000 mg | ORAL_TABLET | ORAL | Status: DC | PRN

## 2013-02-24 MED ORDER — SULFAMETHOXAZOLE-TMP DS 800-160 MG PO TABS: 2.0000 | ORAL_TABLET | Freq: Two times a day (BID) | ORAL | Status: DC

## 2013-02-24 NOTE — Progress Notes (Signed)
Patient given discharge instruction and teach back method used. Patient denies any questions and verbalizes understanding.  IV removed.

## 2013-02-24 NOTE — Progress Notes (Signed)
Pt discharged to home after visit summary reviewed and pt capable of re verbalizing medications and follow up appointments. Pt remains stable. No signs and symptoms of distress. Educated to return to ER in the event of SOB, dizziness, chest pain, or fainting. Calen Geister, RN   

## 2013-02-24 NOTE — Discharge Summary (Signed)
Physician Discharge Summary  Meghan Jackson:[id number] DOB: 11/20/1980 DOA: 02/21/2013  PCP: Robley Fries, MD  Admit date: 02/21/2013 Discharge date: 02/24/2013  Recommendations for Outpatient Follow-up:  1. Pt will need to follow up with PCP in 1 weeks post discharge 2. Please obtain BMP to evaluate electrolytes and kidney function on 02/28/13 3. Please also check CBC to evaluate Hg and Hct levels   Discharge Diagnoses:  Principal Problem:   Sepsis Active Problems:   Cellulitis and abscess of buttock   Hypotension   Hyponatremia   Leukocytosis Sepsis  -present at the time of admission  -secondary to gluteal abscess  -Pt received IVF throughout hospitalization with stabilization of BP -UA without pyuria  -Blood pressure and tachycardia have improved  -Blood cultures CoNS in 1 of 2 sets, likely contaminant Gluteal abscess  -continue empiric vancomycin and zosyn pending culture data -Antibiotics were narrowed-->d/c zosyn -Pt will be d/c home with Bactrim DS, 2 tabs bid x 11 more days to finish 14 days of abx total -local wound care--pack wound once daily at home and cover with 4x4 and change prn soiling -pain control   -Induration and erythema are improving  -Wound culture--unreliable, not indicative of true organism as this was obtained at the bedside Hyponatremia  -likely volume depletion  -Improved with IVF Bacteremia -likely a contaminant as discussed above   Discharge Condition:   Disposition: home  Diet:regular Wt Readings from Last 3 Encounters:  02/23/13 78.8 kg (173 lb 11.6 oz)   Brief history  32 year old female with chronic medical problems presents with 5 day history of increasing erythema, edema, and pain of her buttock. Patient states that it started as a boil the gradually got worse. Pt states she has a hx of boils in the past that come and go, but they usually drain and resolve spontaneously. She began having fevers and chills at home for 24  hrs which prompted her to come to the ED. She was found to have buttock abscess which was drained in the ED. She was hypotensive in ED with SBP in 80s. She also had a fever up to 101.42F.  IVF were started with improvement. She was started on empiric vancomycin and zosyn. The patient's WBC, fever, and blood pressure improved with antibiotic treatment and fluid resuscitation. The patient's antibiotics were de-escalated.  The patient will be transitioned to oral antibiotics for discharge home. She will finish a 14 day course of antibiotics. Wound care was continued throughout the hospitalization and her wound improved. Blood cultures grew coagulase-negative Staphylococcus in one of 2 sets, likely representing a contaminant. I personally showed pt's spouse how to pack and care for the gluteal wound and he feels comfortable to do so at home.     Discharge Exam: Filed Vitals:   02/24/13 1005  BP: 110/62  Pulse: 82  Temp: 98.4 F (36.9 C)  Resp: 18   Filed Vitals:   02/23/13 1625 02/23/13 2029 02/24/13 0424 02/24/13 1005  BP: 98/58 107/63 94/68 110/62  Pulse: 78 91 75 82  Temp: 98.6 F (37 C) 98.7 F (37.1 C) 98 F (36.7 C) 98.4 F (36.9 C)  TempSrc:  Oral Oral Oral  Resp: 18 18 18 18   Height:  5\' 4"  (1.626 m)    Weight:  78.8 kg (173 lb 11.6 oz)    SpO2: 100% 97% 98% 99%   General: A&O x 3, NAD, pleasant, cooperative Cardiovascular: RRR, no rub, no gallop, no S3 Respiratory: CTAB, no wheeze, no rhonchi Abdomen:soft,  nontender, nondistended, positive bowel sounds Extremities: No edema, No lymphangitis, no petechiae;  Gluteal wound--;Incision superior portion of gluteal fold with no surrouding induration and no erythema--no crepitance, no necrosis. Scant serosanguineous drainage   Discharge Instructions     Medication List         ibuprofen 200 MG tablet  Commonly known as:  ADVIL,MOTRIN  Take 400 mg by mouth every 6 (six) hours as needed.     oxyCODONE 5 MG immediate release  tablet  Commonly known as:  Oxy IR/ROXICODONE  Take 1 tablet (5 mg total) by mouth every 4 (four) hours as needed for moderate pain.     sulfamethoxazole-trimethoprim 800-160 MG per tablet  Commonly known as:  BACTRIM DS  Take 2 tablets by mouth every 12 (twelve) hours.     UNABLE TO FIND  [name] was admitted to [facility] from 02/21/13 through 02/24/13.  She is medically stable to return to work.  Please contact me if you have any questions.         The results of significant diagnostics from this hospitalization (including imaging, microbiology, ancillary and laboratory) are listed below for reference.    Significant Diagnostic Studies: Dg Chest Portable 1 View  02/22/2013   CLINICAL DATA:  Abscess  EXAM: PORTABLE CHEST - 1 VIEW  COMPARISON:  None.  FINDINGS: The heart size and mediastinal contours are within normal limits. Both lungs are clear. The visualized skeletal structures are unremarkable.  IMPRESSION: No acute cardiopulmonary process.   Electronically Signed   By: Rise Mu M.D.   On: 02/22/2013 03:39     Microbiology: Recent Results (from the past 240 hour(s))  CULTURE, BLOOD (ROUTINE X 2)     Status: None   Collection Time    02/22/13  1:55 AM      Result Value Range Status   Specimen Description BLOOD RIGHT HAND   Final   Special Requests     Final   Value: BOTTLES DRAWN AEROBIC AND ANAEROBIC  BLUE 10CC RED 7CC   Culture  Setup Time     Final   Value: 02/22/2013 10:59     Performed at [facility]   Culture     Final   Value: STAPHYLOCOCCUS SPECIES (COAGULASE NEGATIVE)     Note: THE SIGNIFICANCE OF ISOLATING THIS ORGANISM FROM A SINGLE SET OF BLOOD CULTURES WHEN MULTIPLE SETS ARE DRAWN IS UNCERTAIN. PLEASE NOTIFY THE MICROBIOLOGY DEPARTMENT WITHIN ONE WEEK IF SPECIATION AND SENSITIVITIES ARE REQUIRED.     Note: Gram Stain Report Called to,Read Back By and Verified With: BALENCIA MCNIGHT 02/23/13 @ 11:50AM BY RUSCOE A.      Performed at [facility]   Report Status 02/24/2013 FINAL   Final  CULTURE, BLOOD (ROUTINE X 2)     Status: None   Collection Time    02/22/13  2:11 AM      Result Value Range Status   Specimen Description BLOOD LEFT ARM   Final   Special Requests BOTTLES DRAWN AEROBIC ONLY 10CC   Final   Culture  Setup Time     Final   Value: 02/22/2013 10:59     Performed at [facility]   Culture     Final   Value:        BLOOD CULTURE RECEIVED NO GROWTH TO DATE CULTURE WILL BE HELD FOR 5 DAYS BEFORE ISSUING A FINAL NEGATIVE REPORT     Performed at [facility]  Report Status PENDING   Incomplete  URINE CULTURE     Status: None   Collection Time    02/22/13  2:34 AM      Result Value Range Status   Specimen Description URINE, RANDOM   Final   Special Requests NONE   Final   Culture  Setup Time     Final   Value: 02/22/2013 11:39     Performed at [facility] Count     Final   Value: 20,OOO COLONIES/ML     Performed at [facility]   Culture     Final   Value: Multiple bacterial morphotypes present, none predominant. Suggest appropriate recollection if clinically indicated.     Performed at [facility]   Report Status 02/23/2013 FINAL   Final  WOUND CULTURE     Status: None   Collection Time    02/22/13  6:20 AM      Result Value Range Status   Specimen Description WOUND   Final   Special Requests NONE   Final   Gram Stain     Final   Value: FEW WBC PRESENT, PREDOMINANTLY PMN     RARE SQUAMOUS EPITHELIAL CELLS PRESENT     FEW GRAM NEGATIVE RODS     FEW GRAM POSITIVE COCCI IN PAIRS     Performed at [facility]   Culture     Final   Value: FEW ESCHERICHIA COLI     Performed at [facility]   Report Status PENDING   Incomplete     Labs: Basic Metabolic Panel:  Recent Labs Lab 02/22/13 0029 02/23/13 0430 02/24/13 0600  NA 132* 137 139  K 4.1 4.1 4.0  CL 100 106 107  CO2 24 25 24   GLUCOSE 106* 93 95   BUN 12 8 12   CREATININE 0.89 0.90 0.99  CALCIUM 9.1 8.7 8.9   Liver Function Tests: No results found for this basename: AST, ALT, ALKPHOS, BILITOT, PROT, ALBUMIN,  in the last 168 hours No results found for this basename: LIPASE, AMYLASE,  in the last 168 hours No results found for this basename: AMMONIA,  in the last 168 hours CBC:  Recent Labs Lab 02/22/13 0029 02/23/13 0430 02/24/13 0600  WBC 13.9* 10.9* 7.6  NEUTROABS 9.6*  --   --   HGB 12.4 11.1* 11.2*  HCT 37.7 33.7* 34.5*  MCV 86.5 86.4 86.3  PLT 232 207 229   Cardiac Enzymes: No results found for this basename: CKTOTAL, CKMB, CKMBINDEX, TROPONINI,  in the last 168 hours BNP: No components found with this basename: POCBNP,  CBG: No results found for this basename: GLUCAP,  in the last 168 hours  Time coordinating discharge:  Greater than 30 minutes  Signed:  Riya Huxford, DO [facility] Pager: [phone] 02/24/2013, 11:17 AM

## 2013-02-28 LAB — CULTURE, BLOOD (ROUTINE X 2): Culture: NO GROWTH

## 2013-07-07 ENCOUNTER — Ambulatory Visit (INDEPENDENT_AMBULATORY_CARE_PROVIDER_SITE_OTHER): Payer: BC Managed Care – PPO | Admitting: Family Medicine

## 2013-07-07 ENCOUNTER — Encounter: Payer: Self-pay | Admitting: Family Medicine

## 2013-07-07 VITALS — BP 115/79 | HR 86 | Ht 64.0 in | Wt 177.0 lb

## 2013-07-07 DIAGNOSIS — R1012 Left upper quadrant pain: Secondary | ICD-10-CM

## 2013-07-07 DIAGNOSIS — Z0000 Encounter for general adult medical examination without abnormal findings: Secondary | ICD-10-CM

## 2013-07-07 LAB — CMP AND LIVER
ALBUMIN: 4.2 g/dL (ref 3.5–5.2)
ALK PHOS: 67 U/L (ref 39–117)
ALT: 16 U/L (ref 0–35)
AST: 13 U/L (ref 0–37)
BILIRUBIN INDIRECT: 0.3 mg/dL (ref 0.2–1.2)
BILIRUBIN TOTAL: 0.4 mg/dL (ref 0.2–1.2)
BUN: 14 mg/dL (ref 6–23)
Bilirubin, Direct: 0.1 mg/dL (ref 0.0–0.3)
CO2: 23 meq/L (ref 19–32)
Calcium: 9.5 mg/dL (ref 8.4–10.5)
Chloride: 103 mEq/L (ref 96–112)
Creat: 0.89 mg/dL (ref 0.50–1.10)
Glucose, Bld: 95 mg/dL (ref 70–99)
Potassium: 4.2 mEq/L (ref 3.5–5.3)
Sodium: 135 mEq/L (ref 135–145)
Total Protein: 7.4 g/dL (ref 6.0–8.3)

## 2013-07-07 LAB — TSH: TSH: 0.401 u[IU]/mL (ref 0.350–4.500)

## 2013-07-07 LAB — LIPID PANEL
CHOL/HDL RATIO: 2.8 ratio
CHOLESTEROL: 152 mg/dL (ref 0–200)
HDL: 55 mg/dL (ref >39)
LDL Cholesterol: 87 mg/dL (ref 0–99)
Triglycerides: 49 mg/dL (ref <150)
VLDL: 10 mg/dL (ref 0–40)

## 2013-07-07 NOTE — Assessment & Plan Note (Signed)
33 year old female presents for establishment of care. The patient's OB/GYN needs her care for by Dr. Juliene PinaMody. Routine lab work completed today.

## 2013-07-07 NOTE — Assessment & Plan Note (Signed)
LUQ abdominal pain and loose stool, improved over the past few day. Unclear etiology. Will check basic lab work and monitor clinically.

## 2013-07-07 NOTE — Progress Notes (Signed)
   Subjective:    Patient ID: Meghan Jackson, female    DOB: 01/11/1981, 33 y.o.   MRN: [id number]  HPI 33 y/o [place] [facility]. Patient has not had a previous primary care physician. She is seen on a regular basis by her OB/GYN Dr. Juliene PinaMody.   I have reviewed the patient's past medical history, past surgical history, family history, medications, and social history with the patient. These have been updated in Epic.  Patient reports acute complaint of three-week history of left upper quadrant abdominal pain, she describes the pain as dull in nature, no radiation, she has associated loose stools, when she first developed the symptoms she was having up to 5 loose stools per day, no associated nausea or emesis, the pain at its worst was 6/10, the patient states that her symptoms have been improving over the past couple of days, she is currently asymptomatic, she has had 2 loose stools this morning however they are much firmer than previously, no associated fevers or chills  OB/GYN - patient is currently attempting to become pregnant with her husband, she is using Clomid to help promote ovulation, this is managed by her OB/GYN, her last LMP was in March of 2015  Review of Systems  Constitutional: Negative for fever, chills and fatigue.  Respiratory: Negative for apnea, cough and shortness of breath.   Cardiovascular: Negative for chest pain.  Gastrointestinal: Positive for abdominal pain and diarrhea. Negative for nausea.      Objective:   Physical Exam Vitals: Reviewed General: Pleasant African American female, no acute distress  HEENT: Normocephalic, pupils are equal round and reactive to light, extraocular movements are intact, no scleral icterus, bilateral TMs are pearly-gray without erythema or exudate, nasal septum midline, no rhinorrhea, moist mucous members, uvula midline, no pharyngeal erythema or exudate noted, neck was  supple, no thyromegaly, no anterior or posterior cervical lymphadenopathy Cardiac: Regular rate and rhythm, S1 and S2 present, no murmurs, no heaves or thrills Respiratory: Clear to auscultation bilaterally, normal effort Abdomen: Soft, nontender, bowel sounds present, no rebound, no guarding, no scars Extremities: No edema, 2+ radial pulses bilaterally, 2+ dorsalis pedis pulses bilaterally  Lab work completed on 02/24/2013. Basic metabolic panel showed sodium of 139, potassium of 4.0, chloride of 107, bicarbonate 24, BUN of 12, creatinine 0.99, CBC showed WBC of 7.6, hemoglobin 11.2, hematocrit 34.5, platelets 229     Assessment & Plan:  Please see problem specific assessment and plan.

## 2013-07-07 NOTE — Patient Instructions (Signed)
It was very nice to meet you today.  Lab work - Dr. Randolm IdolFletke will call you with the results  Abdominal Pain - unclear source, check lab work, continue to monitory, if becomes worse or you are having more diarrhea please call to make an appointment

## 2013-07-09 ENCOUNTER — Telehealth: Payer: Self-pay | Admitting: Family Medicine

## 2013-07-09 NOTE — Telephone Encounter (Signed)
Discussed lab results with patient. 

## 2013-10-22 ENCOUNTER — Encounter: Payer: Self-pay | Admitting: Family Medicine

## 2013-10-22 ENCOUNTER — Ambulatory Visit (INDEPENDENT_AMBULATORY_CARE_PROVIDER_SITE_OTHER): Payer: BC Managed Care – PPO | Admitting: Family Medicine

## 2013-10-22 VITALS — BP 97/68 | HR 65 | Temp 98.5°F | Ht 64.0 in | Wt 162.3 lb

## 2013-10-22 DIAGNOSIS — F39 Unspecified mood [affective] disorder: Secondary | ICD-10-CM

## 2013-10-22 DIAGNOSIS — R42 Dizziness and giddiness: Secondary | ICD-10-CM | POA: Diagnosis not present

## 2013-10-22 NOTE — Assessment & Plan Note (Addendum)
Will refer to primary for further w/u, no SI/HI today.  GAD of 18 with extremely difficult to get along with, PHQ 9 of 5 w/ # 9 negative, MDQ with + for thoughts racing through head and easily distracted by several things

## 2013-10-22 NOTE — Patient Instructions (Signed)
Adjustment Disorder °Most changes in life can cause stress. Getting used to changes may take a few months or longer. If feelings of stress, hopelessness, or worry continue, you may have an adjustment disorder. This stress-related mental health problem may affect your feelings, thinking and how you act. It occurs in both sexes and happens at any age. °SYMPTOMS  °Some of the following problems may be seen and vary from person to person: °· Sadness or depression. °· Loss of enjoyment. °· Thoughts of suicide. °· Fighting. °· Avoiding family and friends. °· Poor school performance. °· Hopelessness, sense of loss. °· Trouble sleeping. °· Vandalism. °· Worry, weight loss or gain. °· Crying spells. °· Anxiety °· Reckless driving. °· Skipping school. °· Poor work performance. °· Nervousness. °· Ignoring bills. °· Poor attitude. °DIAGNOSIS  °Your caregiver will ask what has happened in your life and do a physical exam. They will make a diagnosis of an adjustment disorder when they are sure another problem or medical illness causing your feelings does not exist. °TREATMENT  °When problems caused by stress interfere with you daily life or last longer than a few months, you may need counseling for an adjustment disorder. Early treatment may diminish problems and help you to better cope with the stressful events in your life. Sometimes medication is necessary. Individual counseling and or support groups can be very helpful. °PROGNOSIS  °Adjustment disorders usually last less than 3 to 6 months. The condition may persist if there is long lasting stress. This could include health problems, relationship problems, or job difficulties where you can not easily escape from what is causing the problem. °PREVENTION  °Even the most mentally healthy, highly functioning people can suffer from an adjustment disorder given a significant blow from a life-changing event. There is no way to prevent pain and loss. Most people need help from time  to time. You are not alone. °SEEK MEDICAL CARE IF:  °Your feelings or symptoms listed above do not improve or worsen. °Document Released: 11/01/2005 Document Revised: 05/22/2011 Document Reviewed: 01/23/2007 °ExitCare® Patient Information ©2015 [employer]. This information is not intended to replace advice given to you by your health care provider. Make sure you discuss any questions you have with your health care provider. ° °

## 2013-10-22 NOTE — Assessment & Plan Note (Signed)
Decreased intake 2/2 stress/mood concerning for orthostatic episode( BP 97/62 upon presentation).  Transient and no focal weakness, no smoking history, no high BP, no palpitations or hx of afib.  ABCD2 score of 1.  Most likely stress and decrease intake related.  Recommended further intake, f/u with primary for mood disorder w/u, and to f/u urgently if starts to have unilateral weakness or dysarthria.

## 2013-10-22 NOTE — Progress Notes (Signed)
Meghan Jackson is a 33 y.o. female who presents today for dizziness and weakness and increased stress.  Pt states she went to work today and was sitting down when she felt slightly dizzy, tried to stand up and felt weak at that time.  This happened once and has not happened since then.  The episode lasted about 20 minutes and has never had before.  She does endorse increased stress, decreased energy, and decreased hope with her home situation over the last 3 weeks (worse during this time), but has been ongoing now for over a year.  She denies any SI/HI, drug use/abuse, or FHx of mood disorders.  She does see a therapist once per week which helps somewhat.    Specifically with her dizziness, she denies any weakness, denies blurred vision, HA, unilateral sensation loss or paresthesias.  Denies palpitations, shortness of breath, chest pain, nausea, vomiting, diarrhea, dysphagia, dysarthria.    Past Medical History  Diagnosis Date  . Hemorrhoids   . Anal fissure   . Irregular heart rhythm     History  Smoking status  . Never Smoker   Smokeless tobacco  . Never Used    Family History  Problem Relation Age of Onset  . Kidney disease Father   . Cancer Maternal Grandmother     No current outpatient prescriptions on file prior to visit.   No current facility-administered medications on file prior to visit.    ROS: Per HPI.  All other systems reviewed and are negative.   Physical Exam Filed Vitals:   10/22/13 1601  BP: 97/68  Pulse: 65  Temp: 98.5 F (36.9 C)    Physical Examination: General appearance - alert, well appearing, and in no distress Mental status - anxious, cooperative  Chest - clear to auscultation, no wheezes, rales or rhonchi, symmetric air entry Heart - normal rate and regular rhythm, no murmurs noted Neurological - alert, oriented, normal speech, no focal findings or movement disorder noted, cranial nerves II through XII intact, DTR's normal and symmetric,  motor and sensory grossly normal bilaterally    Chemistry      Component Value Date/Time   NA 135 07/07/2013 1021   K 4.2 07/07/2013 1021   CL 103 07/07/2013 1021   CO2 23 07/07/2013 1021   BUN 14 07/07/2013 1021   CREATININE 0.89 07/07/2013 1021   CREATININE 0.99 02/24/2013 0600      Component Value Date/Time   CALCIUM 9.5 07/07/2013 1021   ALKPHOS 67 07/07/2013 1021   AST 13 07/07/2013 1021   ALT 16 07/07/2013 1021   BILITOT 0.4 07/07/2013 1021      Lab Results  Component Value Date   WBC 7.6 02/24/2013   HGB 11.2* 02/24/2013   HCT 34.5* 02/24/2013   MCV 86.3 02/24/2013   PLT 229 02/24/2013   Lab Results  Component Value Date   TSH 0.401 07/07/2013   No results found for this basename: HGBA1C

## 2015-12-25 ENCOUNTER — Other Ambulatory Visit: Payer: Self-pay | Admitting: Family Medicine

## 2015-12-25 DIAGNOSIS — E010 Iodine-deficiency related diffuse (endemic) goiter: Secondary | ICD-10-CM

## 2015-12-25 DIAGNOSIS — N644 Mastodynia: Secondary | ICD-10-CM

## 2016-01-04 ENCOUNTER — Ambulatory Visit
Admission: RE | Admit: 2016-01-04 | Discharge: 2016-01-04 | Disposition: A | Discharge status: Home / Self Care | Payer: BC Managed Care – PPO | Source: Ambulatory Visit | Attending: Family Medicine | Admitting: Family Medicine

## 2016-01-04 DIAGNOSIS — E010 Iodine-deficiency related diffuse (endemic) goiter: Secondary | ICD-10-CM

## 2016-01-11 ENCOUNTER — Other Ambulatory Visit: Payer: Self-pay | Admitting: Family Medicine

## 2016-01-11 DIAGNOSIS — R0789 Other chest pain: Secondary | ICD-10-CM

## 2016-01-11 DIAGNOSIS — N644 Mastodynia: Secondary | ICD-10-CM

## 2016-01-12 ENCOUNTER — Ambulatory Visit
Admission: RE | Admit: 2016-01-12 | Discharge: 2016-01-12 | Disposition: A | Discharge status: Home / Self Care | Payer: BC Managed Care – PPO | Source: Ambulatory Visit | Attending: Family Medicine | Admitting: Family Medicine

## 2016-01-12 ENCOUNTER — Other Ambulatory Visit: Payer: Self-pay | Admitting: Family Medicine

## 2016-01-12 DIAGNOSIS — N644 Mastodynia: Secondary | ICD-10-CM

## 2016-01-12 DIAGNOSIS — R0789 Other chest pain: Secondary | ICD-10-CM

## 2016-01-12 DIAGNOSIS — E041 Nontoxic single thyroid nodule: Secondary | ICD-10-CM

## 2016-01-21 ENCOUNTER — Other Ambulatory Visit (HOSPITAL_COMMUNITY)
Admission: RE | Admit: 2016-01-21 | Discharge: 2016-01-21 | Disposition: A | Discharge status: Home / Self Care | Payer: BC Managed Care – PPO | Source: Ambulatory Visit | Attending: General Surgery | Admitting: General Surgery

## 2016-01-21 ENCOUNTER — Ambulatory Visit
Admission: RE | Admit: 2016-01-21 | Discharge: 2016-01-21 | Disposition: A | Discharge status: Home / Self Care | Payer: BC Managed Care – PPO | Source: Ambulatory Visit | Attending: Family Medicine | Admitting: Family Medicine

## 2016-01-21 DIAGNOSIS — E041 Nontoxic single thyroid nodule: Secondary | ICD-10-CM

## 2016-04-05 ENCOUNTER — Ambulatory Visit (INDEPENDENT_AMBULATORY_CARE_PROVIDER_SITE_OTHER): Payer: BC Managed Care – PPO | Admitting: Internal Medicine

## 2016-04-05 ENCOUNTER — Encounter: Payer: Self-pay | Admitting: Internal Medicine

## 2016-04-05 VITALS — BP 112/72 | HR 90 | Ht 64.0 in | Wt 150.0 lb

## 2016-04-05 DIAGNOSIS — E042 Nontoxic multinodular goiter: Secondary | ICD-10-CM

## 2016-04-05 DIAGNOSIS — N96 Recurrent pregnancy loss: Secondary | ICD-10-CM

## 2016-04-05 NOTE — Patient Instructions (Signed)
Please return in 1 year.  Please call me or send me a message to order you thyroid U/S ~3 weeks before next visit.

## 2016-04-05 NOTE — Progress Notes (Signed)
Patient ID: Meghan Jackson, female   DOB: [birth year], 36 y.o.   MRN: [id number]    HPI  Meghan Jackson is a 36 y.o.-year-old female, referred by her PCP, Boneta LucksJennifer Brown, NP, for evaluation and management of thyroid nodules.  She is seeing Dr. 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 Labs returned normal. Menstrual cycles regular, a little heavy. She is ovulating normally. She was on Clomid. She had 2 miscarriages. She was found to have an enlarged thyroid at appt with Dr. Juliene PinaMody >> saw Dr. Jillyn HiddenFulp >> obtained a thyroid U/S that showed several thyroid nodules. She is wondering if her miscarriages could be related to her thyroid condition.  Thyroid U/S (01/05/2016):  Isthmic 1.1 cm isoechoic nodule  Right mid 2.8 x 1.3 x 2.1 cm solid isoechoic nodule; few other small scattered cystic areas including a 0.6 cm predominately cystic nodule in the superior right lobe and a 0.5 cm partially cystic nodule in the inferior right lobe which are clearly low suspicion.  Left superior 3.1 x 1.9 x 3 cm solid, isoechoic, nodule  FNA of both dominant nodules (01/21/2016): Benign  Pt denies: - feeling nodules in neck - hoarseness - dysphagia - choking - SOB with lying down  I reviewed pt's thyroid tests: 12/25/2015: TSH 0.794, total T4 6.7, TPO antibodies 15 (0-34) Lab Results  Component Value Date   TSH 0.401 07/07/2013    Pt denies: - fatigue - heat intolerance/cold intolerance - tremors - palpitations - anxiety/depression - hyperdefecation/constipation - weight loss/weight gain - dry skin - hair loss  No FH of thyroid ds. No FH of thyroid cancer. No h/o radiation tx to head or neck.  No seaweed or kelp. No recent contrast studies. No steroid use. No herbal supplements. No Biotin supplements or Hair, Skin and Nails vitamins.  Pt also has a history of sepsis from a boil.  ROS: Constitutional: + weight loss, no fatigue, no subjective hyperthermia/hypothermia Eyes: no blurry vision, no  xerophthalmia ENT: no sore throat, no nodules palpated in throat, no dysphagia/odynophagia, no hoarseness Cardiovascular: no CP/SOB/palpitations/leg swelling Respiratory: no cough/SOB Gastrointestinal: no N/V/D/C Musculoskeletal: no muscle/joint aches Skin: no rashes Neurological: no tremors/numbness/tingling/dizziness Psychiatric: no depression/anxiety  Past Medical History:  Diagnosis Date  . Anal fissure   . Hemorrhoids   . Irregular heart rhythm    No past surgical history on file. Social History   Social History  . Marital status: Married     Spouse name: N/A  . Number of children: 0   Occupational History  . Internal trainer   Social History Main Topics  . Smoking status: Never Smoker  . Smokeless tobacco: Never Used  . Alcohol use     1 Glasses of wine Every 2 months      Comment: occ  . Drug use: No  . Sexual activity: Yes   No current outpatient prescriptions on file prior to visit.   No current facility-administered medications on file prior to visit.    Allergies  Allergen Reactions  . Latex Itching and Other (See Comments)    dryness   Family History  Problem Relation Age of Onset  . Kidney disease Father   . Cancer Maternal Grandmother    PE: BP 112/72   Pulse 90   Ht 5\' 4"  (1.626 m)   Wt 150 lb (68 kg)   SpO2 99%   BMI 25.75 kg/m  Wt Readings from Last 3 Encounters:  04/05/16 150 lb (68 kg)  10/22/13 162  lb 4.8 oz (73.6 kg)  07/07/13 177 lb (80.3 kg)   Constitutional: normal weight, in NAD Eyes: PERRLA, EOMI, no exophthalmos ENT: moist mucous membranes, + Symmetric thyromegaly, + thyroid nodule palpable midline in low cervical region, no cervical lymphadenopathy Cardiovascular: RRR, No MRG Respiratory: CTA B Gastrointestinal: abdomen soft, NT, ND, BS+ Musculoskeletal: no deformities, strength intact in all 4;  Skin: moist, warm, no rashes Neurological: no tremor with outstretched hands, DTR normal in all 4  ASSESSMENT: 1.  Multiple thyroid nodules  2. Recurrent miscarriages  PLAN: 1. Multiple thyroid nodules - I reviewed the images of her thyroid ultrasound along with the patient. I pointed out that the 2 dominant nodules are large, this being a risk factor for cancer. However, these were biopsied and they were both benign. She has a third smaller isthmic nodule, isoechoic with internal degeneration, without concerning features. The dominant nodules are: - Isoechoic - without microcalcifications - without internal blood flow - more wide than tall - well delimited from surrounding tissue Pt does not have a thyroid cancer family history or a personal history of RxTx to head/neck. All these would favor benignity.  - I explained that this since her nodules were not cancerous, we can continue to follow her on a yearly basis, and check another ultrasound in another year and less frequently afterwards - she should let me know if she develops neck compression symptoms before next visit - I did explain that, while thyroid surgery is not a complicated one, it still can have side effects and also she might have a risk of ~25% of becoming hypothyroid after hemithyroidectomy. As she does not have neck compression symptoms and is not concerned about the cosmetic appearance of her thyroid, she is not interested in thyroid surgery at this point. - I'll see her back in a year, but I advised her to call me 3 weeks before our next appointment so I can order a new thyroid ultrasound and will review the images when she returns for the visit - I advised pt to join my chart   2.Recurrent miscarriages - I reassured the patient that her miscarriages are not related to the thyroid, since her thyroid function is normal (per l check in 12/2015) ast and she does not have high thyroid antibodies.   Carlus Pavlov, MD PhD [facility] Endocrinology

## 2017-04-05 ENCOUNTER — Ambulatory Visit: Payer: BC Managed Care – PPO | Admitting: Internal Medicine

## 2017-04-05 VITALS — BP 118/68 | HR 80 | Resp 16 | Ht 64.0 in | Wt 139.0 lb

## 2017-04-05 DIAGNOSIS — N96 Recurrent pregnancy loss: Secondary | ICD-10-CM | POA: Insufficient documentation

## 2017-04-05 DIAGNOSIS — E042 Nontoxic multinodular goiter: Secondary | ICD-10-CM

## 2017-04-05 LAB — TSH: TSH: 1 u[IU]/mL (ref 0.35–4.50)

## 2017-04-05 LAB — T3, FREE: T3, Free: 4.1 pg/mL (ref 2.3–4.2)

## 2017-04-05 LAB — T4, FREE: Free T4: 0.81 ng/dL (ref 0.60–1.60)

## 2017-04-05 NOTE — Progress Notes (Addendum)
Patient ID: Meghan Jackson, female   DOB: 04/05/1980, 37 y.o.   MRN: [id number]    HPI  Meghan Jackson is a 37 y.o.-year-old female, returning for follow-up for thyroid nodules.  Last visit a year ago.  Patient's thyroid nodules were discovered during investigation for infertility.  At last visit, she was telling me that she was seeing Dr. Juliene PinaMody for the previous 3 years for infertility.  She has a history of 2 miscarriages.  She was ovulating normally, and was on Clomid >> now off. She is considering IVF.  During evaluation by Dr. Tildon HuskyModi, she was found to have an enlarged thyroid.  PCP obtained a thyroid ultrasound that showed several nodules.  Reviewed and addended of her imaging test reports: Thyroid U/S (01/05/2016):  Isthmic 1.1 cm isoechoic nodule  Right mid 2.8 x 1.3 x 2.1 cm solid isoechoic nodule; few other small scattered cystic areas including a 0.6 cm predominately cystic nodule in the superior right lobe and a 0.5 cm partially cystic nodule in the inferior right lobe which are clearly low suspicion.  Left superior 3.1 x 1.9 x 3 cm solid, isoechoic, nodule  FNA of both dominant nodules (01/21/2016): Benign  I reviewed pt's thyroid tests: 12/25/2015: TSH 0.794, total T4 6.7, TPO antibodies 15 (0-34) Lab Results  Component Value Date   TSH 0.401 07/07/2013    Pt denies: - feeling nodules in neck - hoarseness - dysphagia - choking - SOB with lying down  No FH of thyroid ds. No FH of thyroid cancer. No h/o radiation tx to head or neck.  No seaweed or kelp. No recent contrast studies. No herbal supplements. No Biotin use. No recent steroids use.   Pt also has a history of sepsis from a boil.  ROS: Constitutional: + weight loss, no fatigue, no subjective hyperthermia, no subjective hypothermia Eyes: no blurry vision, no xerophthalmia ENT: no sore throat, + see HPI Cardiovascular: no CP/no SOB/no palpitations/no leg swelling Respiratory: no cough/no SOB/no  wheezing Gastrointestinal: no N/no V/no D/no C/no acid reflux Musculoskeletal: no muscle aches/no joint aches Skin: no rashes, no hair loss Neurological: no tremors/no numbness/no tingling/no dizziness  I reviewed pt's medications, allergies, PMH, social hx, family hx, and changes were documented in the history of present illness. Otherwise, unchanged from my initial visit note.  Past Medical History:  Diagnosis Date  . Anal fissure   . Hemorrhoids   . Irregular heart rhythm    No past surgical history on file. Social History   Social History  . Marital status: Married     Spouse name: N/A  . Number of children: 0   Occupational History  . Internal trainer   Social History Main Topics  . Smoking status: Never Smoker  . Smokeless tobacco: Never Used  . Alcohol use     1 Glasses of wine Every 2 months      Comment: occ  . Drug use: No  . Sexual activity: Yes   No current outpatient medications on file prior to visit.   No current facility-administered medications on file prior to visit.    Allergies  Allergen Reactions  . Latex Itching and Other (See Comments)    dryness   Family History  Problem Relation Age of Onset  . Kidney disease Father   . Cancer Maternal Grandmother    PE: BP 118/68 (BP Location: Left Arm, Patient Position: Sitting, Cuff Size: Normal)   Pulse 80   Resp 16   Ht 5\' 4"  (  1.626 m)   Wt 139 lb (63 kg)   SpO2 98%   BMI 23.86 kg/m  Wt Readings from Last 3 Encounters:  04/05/17 139 lb (63 kg)  04/05/16 150 lb (68 kg)  10/22/13 162 lb 4.8 oz (73.6 kg)   Constitutional: Normal weight, in NAD Eyes: PERRLA, EOMI, no exophthalmos ENT: moist mucous membranes, + symmetric thyromegaly, + thyroid nodule palpable midline in the low cervical region, no cervical lymphadenopathy Cardiovascular: RRR, No MRG Respiratory: CTA B Gastrointestinal: abdomen soft, NT, ND, BS+ Musculoskeletal: no deformities, strength intact in all 4 Skin: moist, warm, no  rashes Neurological: no tremor with outstretched hands, DTR normal in all 4  ASSESSMENT: 1. Multiple thyroid nodules  2. Recurrent miscarriages  PLAN: 1. Multiple thyroid nodules - I reviewed the report of her thyroid ultrasound and the results of her 2 dominant nodules biopsy along with the patient.  The 2 dominant nodules were enlarged, but isoechoic, without microcalcifications, without internal blood flow, more wide than tall and well the limited from the surrounding tissue.  These are all auspicious signs. She does not have a family history of thyroid cancer or personal history of radiation therapy to the head or neck.  All the above will favor benignity. - Since the nodules were not cancerous, we discussed about continuing to evaluate her clinically and also by ultrasound. - At this visit, she does not complain of any neck compression symptoms. - We will obtain another thyroid ultrasound and if nodules are unchanged, I will see her back in 2 years with the possibility of repeating the ultrasound then - advised the patient to join my chart  - I will see her back in 2 years, but will check thyroid tests today  2. Recurrent miscarriages - I reassured the patient that her miscarriages are not related to her thyroid as her TFTs have been normal.  However, we will recheck her TFTs today.  Component     Latest Ref Rng & Units 04/05/2017  TSH     0.35 - 4.50 uIU/mL 1.00  T4,Free(Direct)     0.60 - 1.60 ng/dL 1.61  Triiodothyronine,Free,Serum     2.3 - 4.2 pg/mL 4.1  TFTs remain normal.  Reading Physician Reading Date Result Priority  Simonne Come, MD 04/19/2017     Narrative    CLINICAL DATA: Prior ultrasound follow-up. History of bilateral thyroid nodule fine-needle aspiration  EXAM: THYROID ULTRASOUND  TECHNIQUE: Ultrasound examination of the thyroid gland and adjacent soft tissues was performed.  COMPARISON: 01/04/2016; bilateral ultrasound-guided bilateral thyroid nodule  fine-needle aspiration - 01/21/2016  FINDINGS: Parenchymal Echotexture: Markedly heterogenous  Isthmus: Enlarged measuring 0.6 cm in diameter, unchanged  Right lobe: Enlarged measuring 7.2 x 3.2 x 3.8 cm, unchanged, previously, 6.9 x 3.3 x 3.9 cm  Left lobe: Enlarged measuring 8.0 x 2.9 x 3.6 cm, unchanged, previously, 6.8 x 3.0 x 3.7 cm  _________________________________________________________  Estimated total number of nodules >/= 1 cm: 1  Number of spongiform nodules >/= 2 cm not described below (TR1): 0  Number of mixed cystic and solid nodules >/= 1.5 cm not described below (TR2): 0  _________________________________________________________  Nodule # 1:  Prior biopsy: No  Location: Isthmus; Mid  Maximum size: 1.1 cm; Other 2 dimensions: 1.1 x 0.9 cm, previously, 1.1 x 1.1 x 0.9 cm  Composition: solid/almost completely solid (2)  Echogenicity: isoechoic (1)  Shape: not taller-than-wide (0)  Margins: ill-defined (0)  Echogenic foci: none (0)  ACR TI-RADS total points: 3.  ACR TI-RADS risk category: TR3 (3 points).  Significant change in size (>/= 20% in two dimensions and minimal increase of 2 mm): No  Change in features: No  Change in ACR TI-RADS risk category: No  ACR TI-RADS recommendations:  Given size (<1.4 cm) and appearance, this nodule does NOT meet TI-RADS criteria for biopsy or dedicated follow-up.  _________________________________________________________  There is a punctate (approximately 0.6 cm) hypoechoic nodule within the superior pole the right lobe of the thyroid which does not meet imaging criteria to recommend percutaneous sampling or dedicated follow-up  Previously biopsied ill-defined approximately 2.8 cm apparent nodule within the mid aspect of the right lobe of the thyroid is less conspicuous on the present examination and thus is favored to have represented a pseudo nodule. Correlation with prior biopsy results is  recommended.  Previously biopsied 3.1 cm nodule/mass within the superior pole of the left lobe of the thyroid is less conspicuous on the present examination and thus favored to have represented a pseudo nodule. Correlation prior biopsy results is recommended.  IMPRESSION: 1. Similar findings of multinodular goiter. No definitive new or enlarging thyroid nodules. 2. Previously biopsied bilateral thyroid nodules are less conspicuous on the present examination and may have represented pseudo nodules. Correlation with prior biopsy results is recommended. Assuming benign pathologic diagnosis, repeat sampling and/or continued dedicated follow-up is not recommended.  The above is in keeping with the ACR TI-RADS recommendations - J Am Coll Radiol 2017;14:587-595.   Electronically Signed By: Simonne Come M.D. On: 04/19/2017 14:37    Carlus Pavlov, MD PhD [facility] Endocrinology

## 2017-04-05 NOTE — Patient Instructions (Addendum)
Please stop at the lab.  We will schedule a new Thyroid U/S.   Please come back for a follow-up appointment in 2 years.

## 2017-04-10 ENCOUNTER — Encounter: Payer: Self-pay | Admitting: Internal Medicine

## 2017-04-18 ENCOUNTER — Other Ambulatory Visit: Payer: Self-pay | Admitting: Internal Medicine

## 2017-04-18 ENCOUNTER — Ambulatory Visit
Admission: RE | Admit: 2017-04-18 | Discharge: 2017-04-18 | Disposition: A | Discharge status: Home / Self Care | Payer: BC Managed Care – PPO | Source: Ambulatory Visit | Attending: Internal Medicine | Admitting: Internal Medicine

## 2017-08-25 IMAGING — US US THYROID BIOPSY
1 series · 13 of 20 positions shown · non-contrast
Comparison: Ultrasound on January 04, 2016

MEDICATIONS:
1% lidocaine

COMPLICATIONS:
None immediate.

INDICATION: Indeterminate right and left thyroid nodules found on physical exam

EXAM:
ULTRASOUND GUIDED FINE NEEDLE ASPIRATION OF INDETERMINATE THYROID
NODULES
TECHNIQUE: Informed written consent was obtained from the patient after a
discussion of the risks, benefits and alternatives to treatment.
Questions regarding the procedure were encouraged and answered. A
timeout was performed prior to the initiation of the procedure.

[Series 1: us thyroid biopsy · 0.07mm/px · 20 acquisitions, 13 frames shown]
[im 1/20]
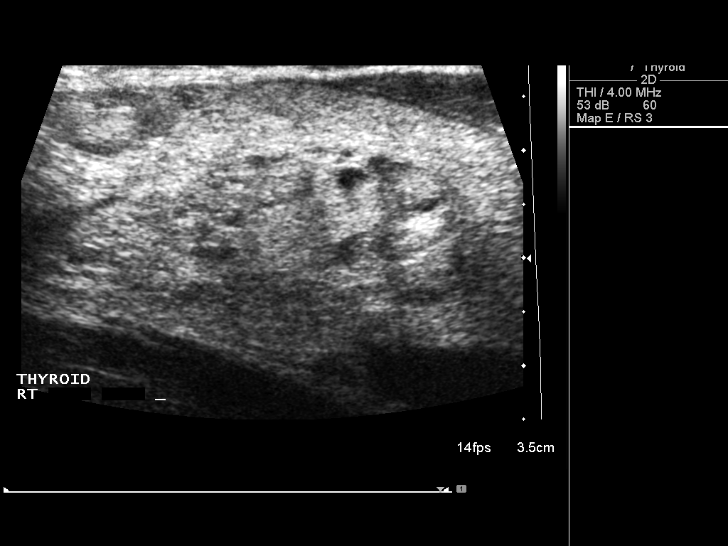
[im 3/20]
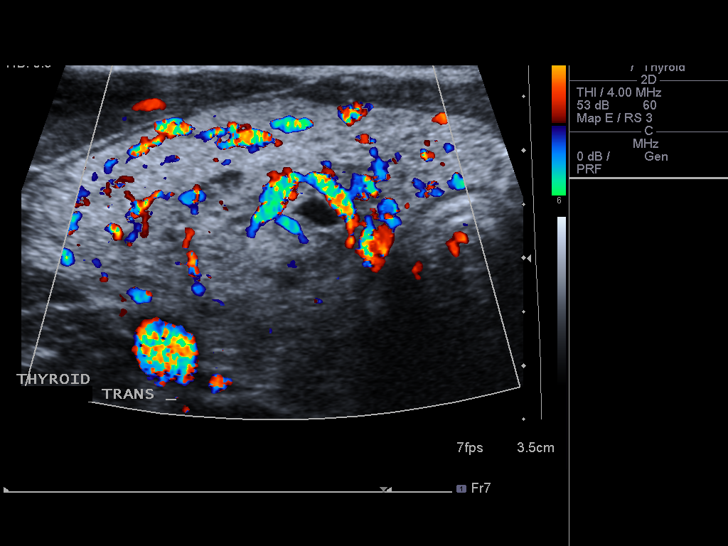
[im 4/20]
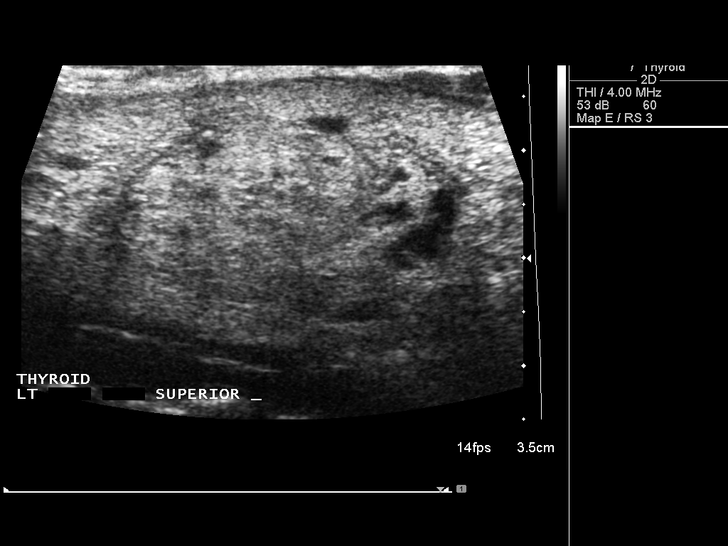
[im 6/20]
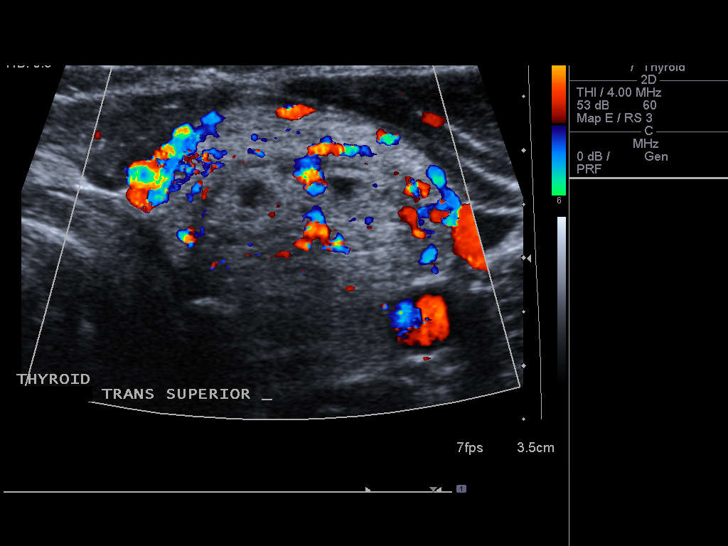
[im 7/20]
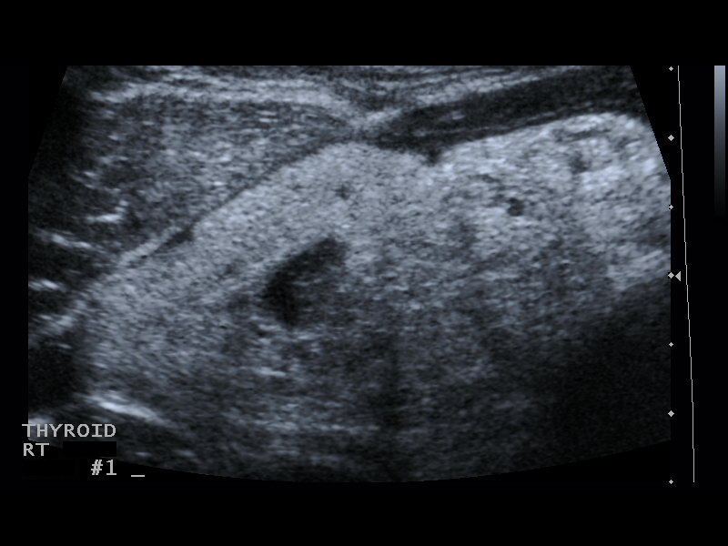
[im 9/20]
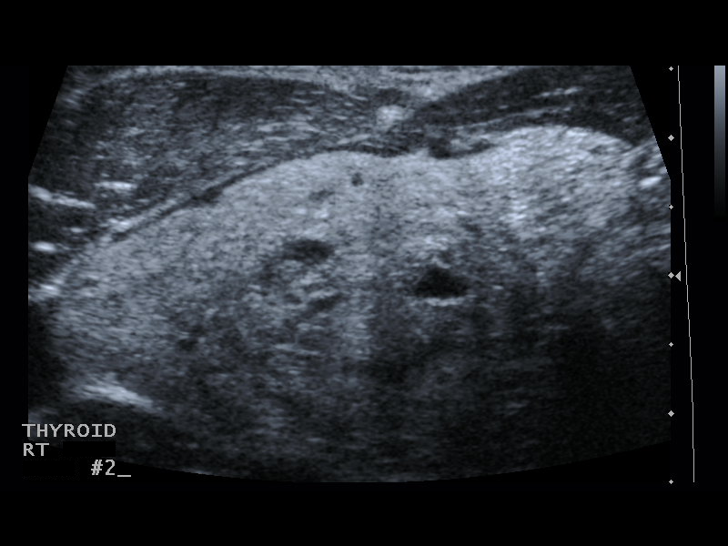
[im 11/20]
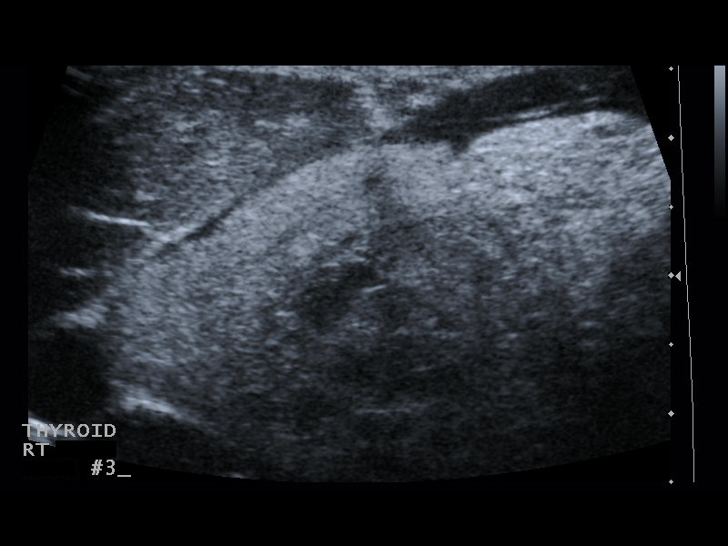
[im 12/20]
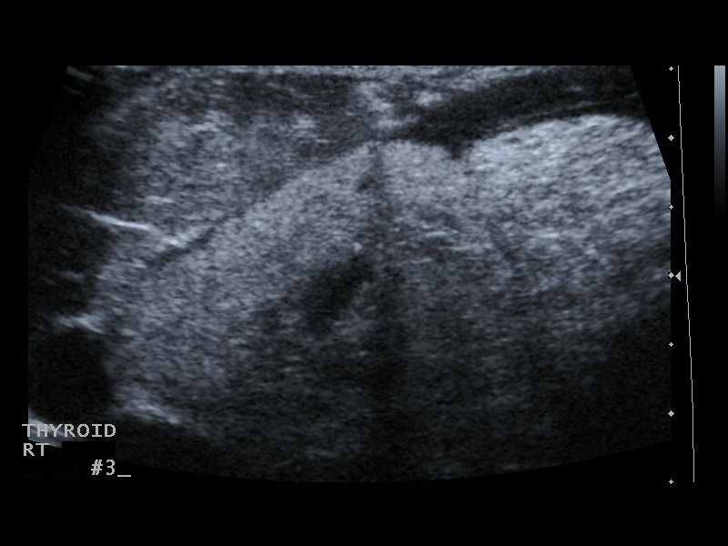
[im 14/20]
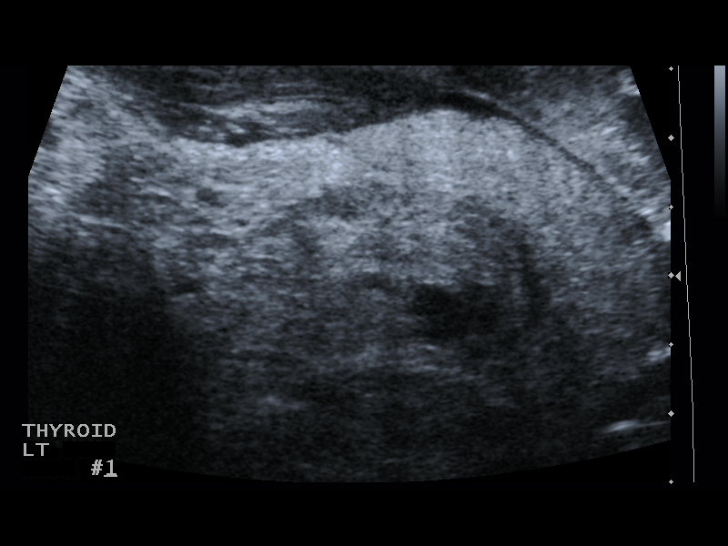
[im 15/20]
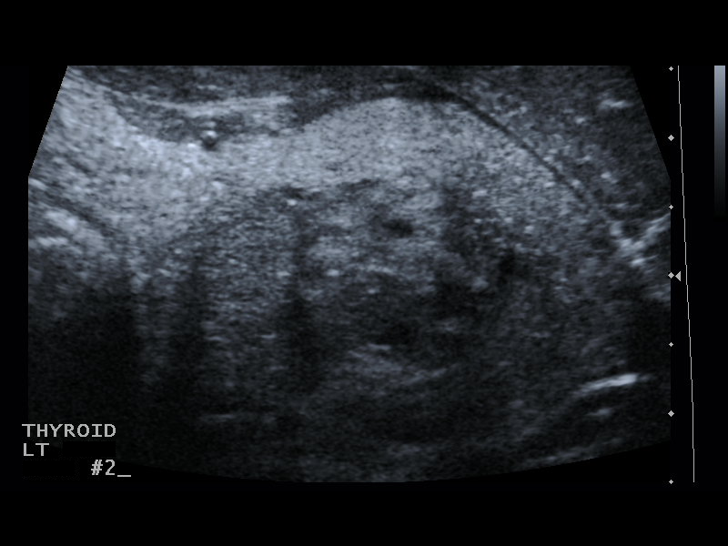
[im 17/20]
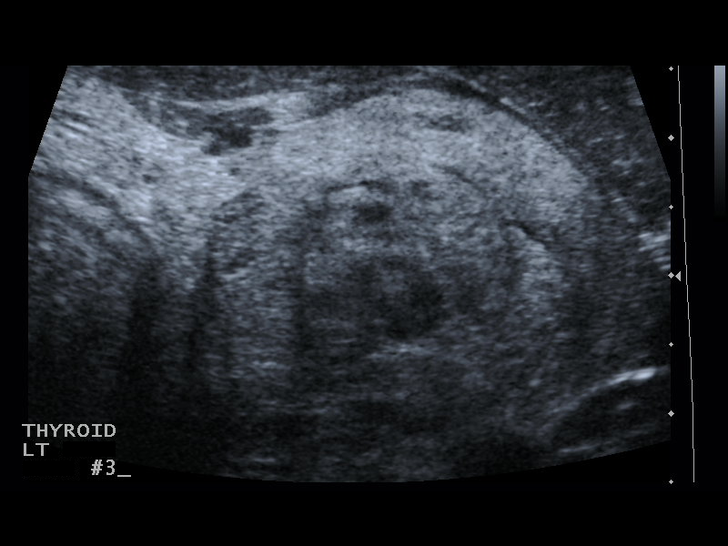
[im 18/20]
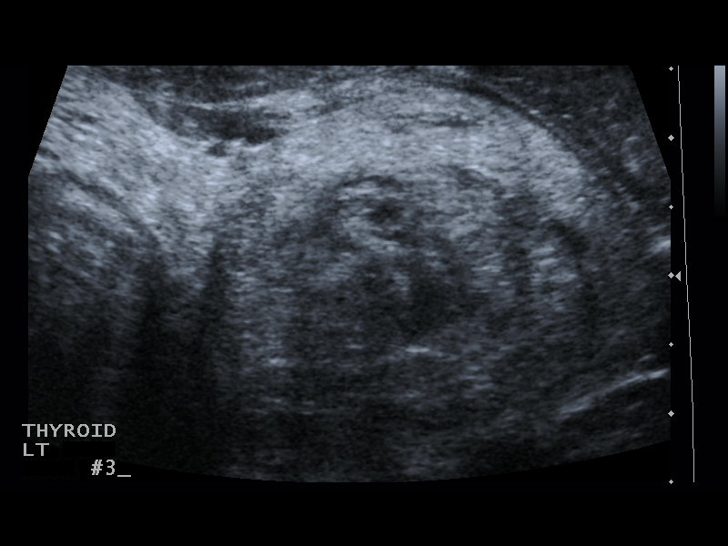
[im 20/20]
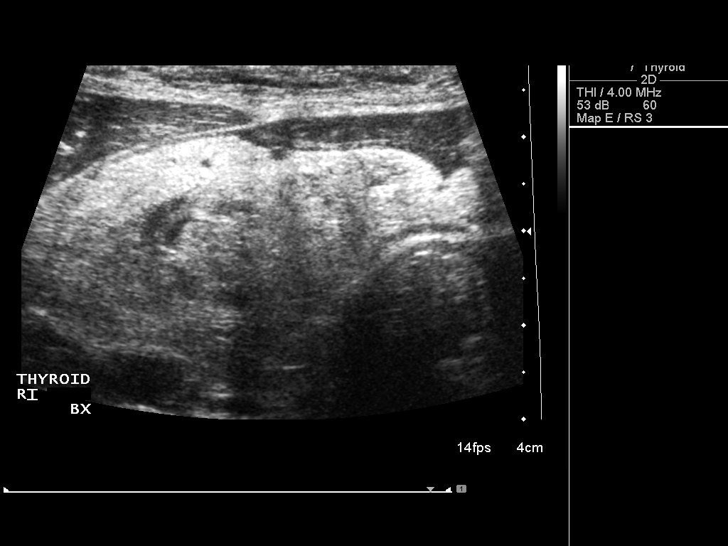

[13 of 20 positions shown; findings below may reference images not displayed]

Pre-procedural ultrasound scanning demonstrated unchanged size and
appearance of the indeterminate nodules within the right and left
thyroid lobes.

The procedure was planned. The neck was prepped in the usual sterile
fashion, and a sterile drape was applied covering the operative
field. A timeout was performed prior to the initiation of the
procedure. Local anesthesia was provided with 1% lidocaine.

Under direct ultrasound guidance, 3 FNA biopsies were performed of
the right nodule with a 25 gauge needle. Multiple ultrasound images
were saved for procedural documentation purposes. The samples were
prepared and submitted to pathology.

Under direct ultrasound guidance, 3 FNA biopsies were performed of
the left nodule with a 25 gauge needle. Multiple ultrasound images
were saved for procedural documentation purposes. The samples were
prepared and submitted to pathology.

Limited post procedural scanning was negative for hematoma or
additional complication. Dressings were placed. The patient
tolerated the above procedures procedure well without immediate
postprocedural complication.
FINDINGS: FINDINGS
Nodule reference number based on prior diagnostic ultrasound: 1

Maximum size: 2.8 cm

Location: Right  ;  Mid

ACR TI-RADS total points: 3

ACR TI-RADS risk category:   TR3 (3 points)

Prior biopsy:  No

Reason for biopsy: meets ACR TI-RADS criteria

_________________________________________________________

Nodule reference number based on prior diagnostic ultrasound: 1

Maximum size: 3.1 cm

Location: Left  ;  Superior

ACR TI-RADS total points: 3

ACR TI-RADS risk category:   TR3 (3 points)

Prior biopsy:  No

Reason for biopsy:  Meets ACR TI-RADS criteria

Ultrasound imaging confirms appropriate placement of the needles
within the thyroid nodule.
IMPRESSION: 1. Technically successful ultrasound guided fine needle aspiration
of right mid thyroid nodule.
2. Technically successful ultrasound guided fine needle aspiration
of left superior thyroid nodule.

## 2018-04-03 ENCOUNTER — Encounter: Payer: Self-pay | Admitting: Obstetrics and Gynecology

## 2018-04-03 ENCOUNTER — Other Ambulatory Visit: Payer: Self-pay

## 2018-05-21 ENCOUNTER — Telehealth: Payer: Self-pay | Admitting: Internal Medicine

## 2018-05-21 NOTE — Telephone Encounter (Signed)
Pt states lymph nodes are swollen and she is going to do a warm compress and take tumeric or silver pills to help them go down, can she use these supplements?

## 2018-05-21 NOTE — Telephone Encounter (Signed)
Please ask her to discuss with her PCP.  Enlarged lymph nodes are not related to the thyroid, but possibly to an upper respiratory infection/inflammation.

## 2018-05-22 NOTE — Telephone Encounter (Signed)
Notified patient of message from Dr. Gherghe, patient expressed understanding and agreement. No further questions.  

## 2018-07-06 IMAGING — US US THYROID
1 series · 12 of 25 positions shown · non-contrast
Comparison: 01/04/2016;

CLINICAL DATA: Prior ultrasound follow-up. History of bilateral
thyroid nodule fine-needle aspiration

EXAM:
THYROID ULTRASOUND
TECHNIQUE: Ultrasound examination of the thyroid gland and adjacent soft
tissues was performed.

[Series 1: us thyroid · 0.06mm/px · 54 acquisitions, 12 frames shown]
[im 3/54]
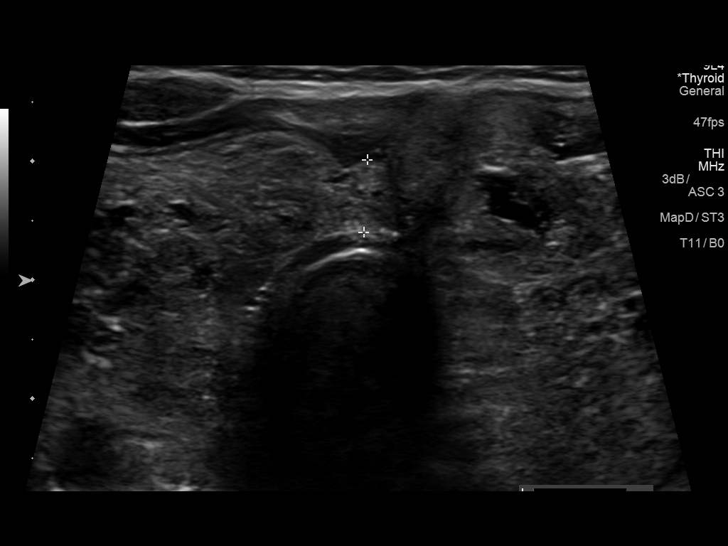
[im 7/54]
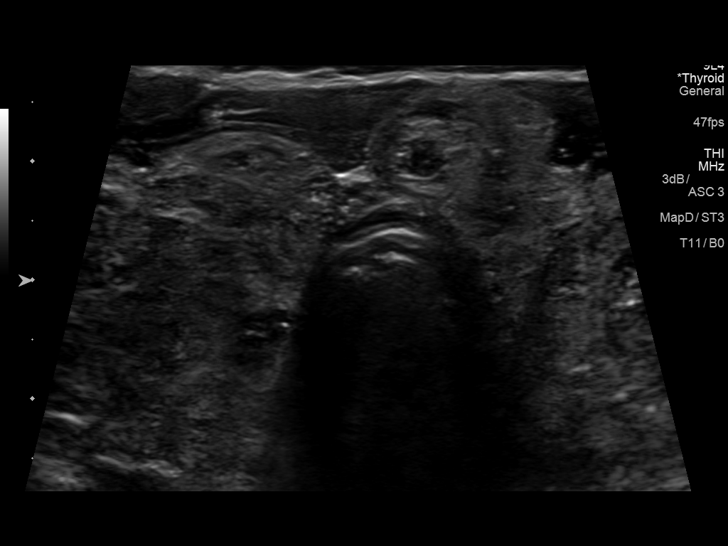
[im 12/54]
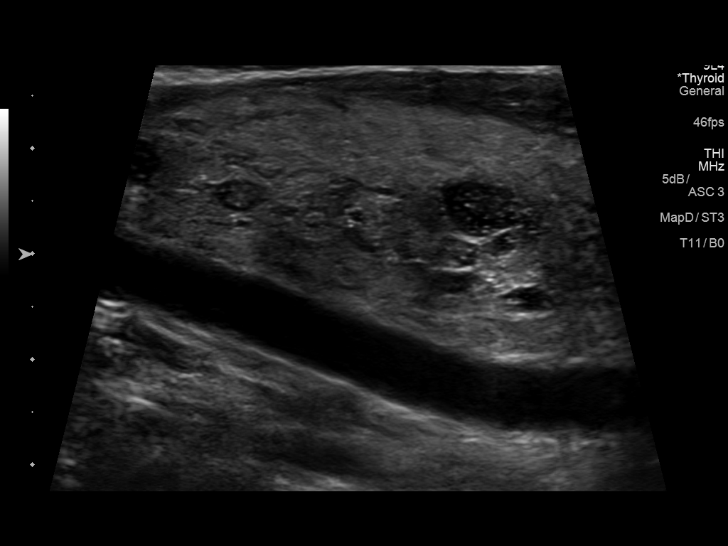
[im 16/54]
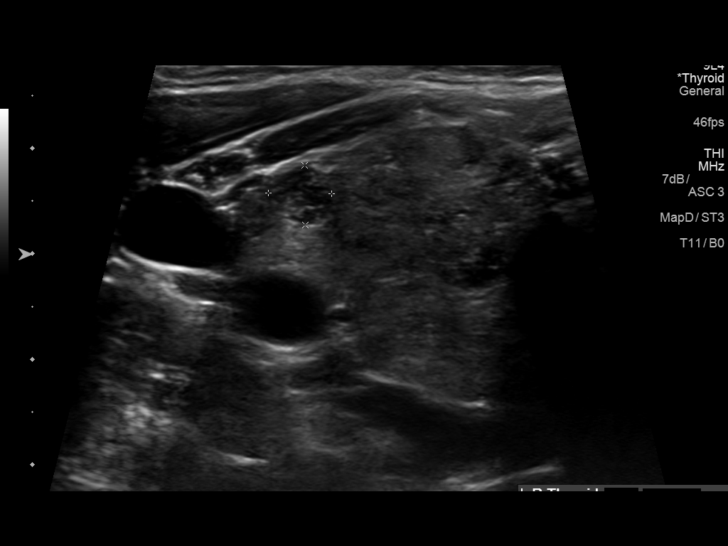
[im 20/54]
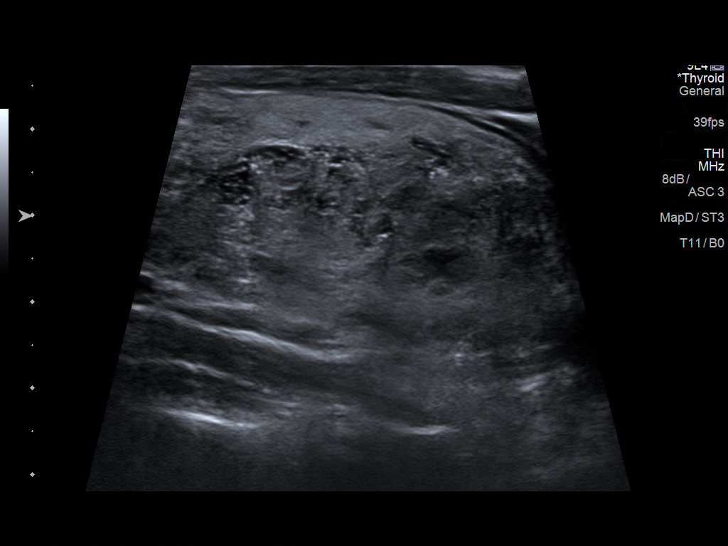
[im 25/54]
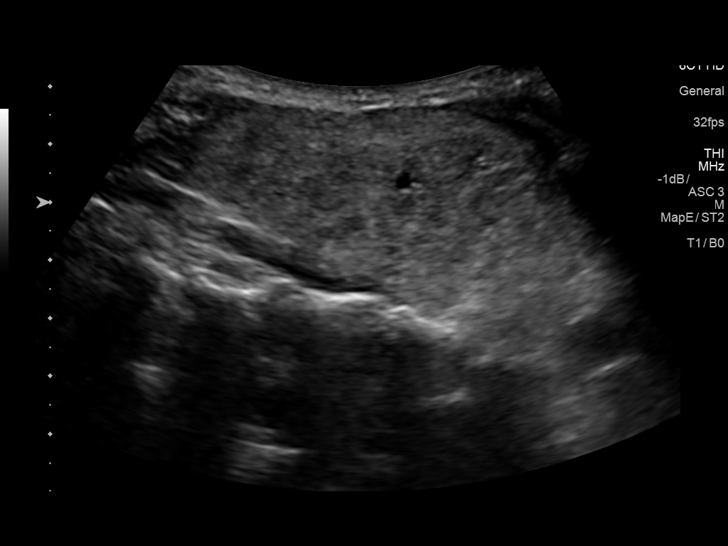
[im 29/54]
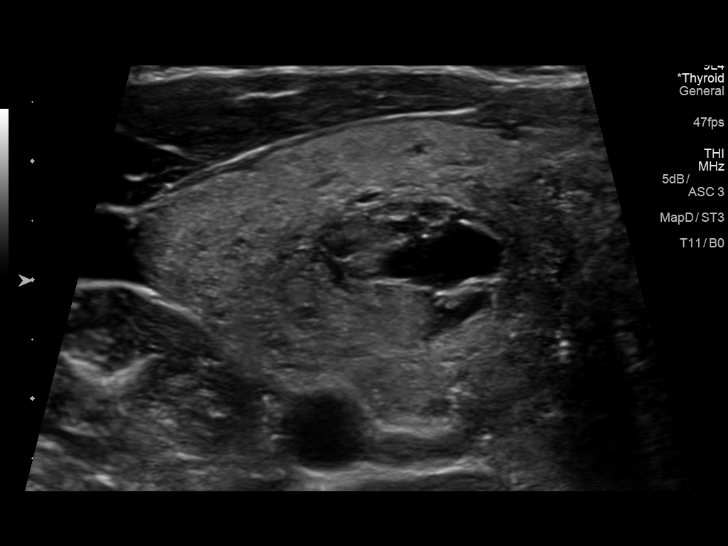
[im 34/54]
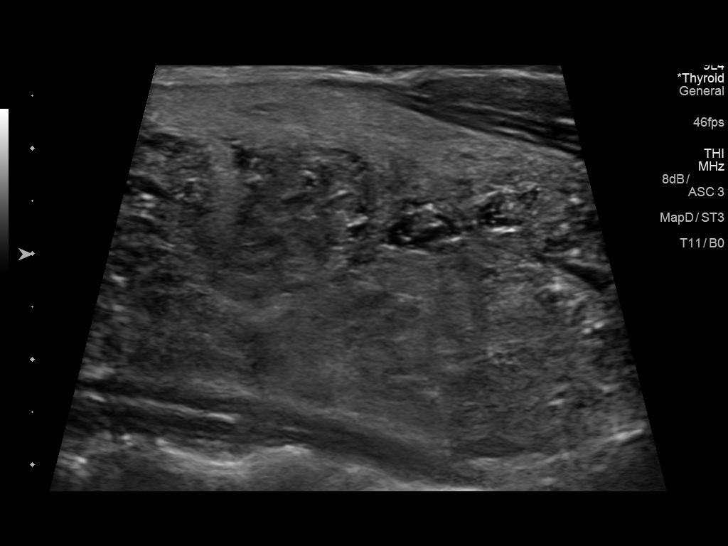
[im 38/54]
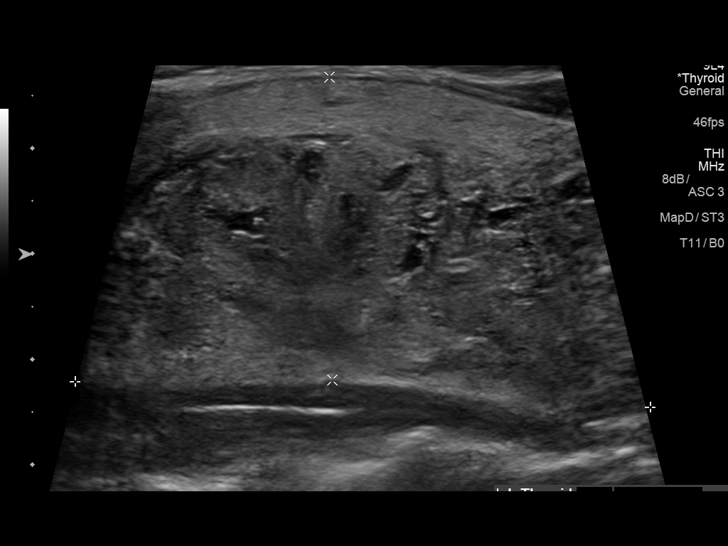
[im 42/54]
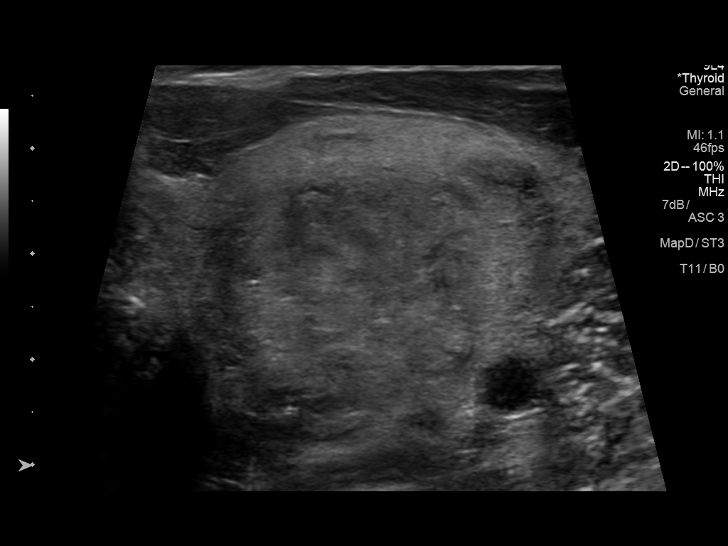
[im 47/54]
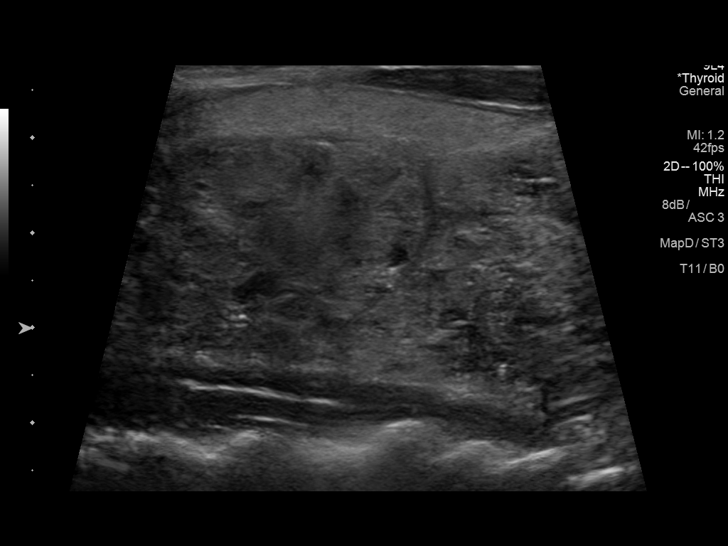
[im 51/54]
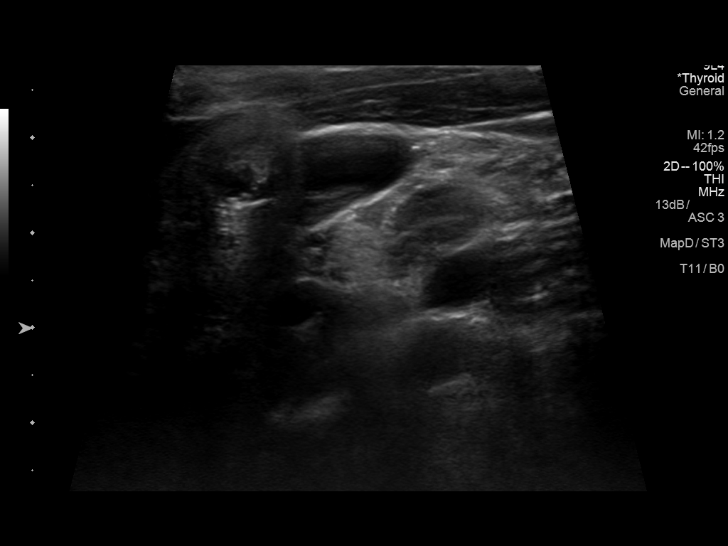

[12 of 25 positions shown; findings below may reference images not displayed]

bilateral ultrasound-guided bilateral
thyroid nodule fine-needle aspiration - 01/21/2016
FINDINGS: Parenchymal Echotexture: Markedly heterogenous

Isthmus: Enlarged measuring 0.6 cm in diameter, unchanged

Right lobe: Enlarged measuring 7.2 x 3.2 x 3.8 cm, unchanged,
previously, 6.9 x 3.3 x 3.9 cm

Left lobe: Enlarged measuring 8.0 x 2.9 x 3.6 cm, unchanged,
previously, 6.8 x 3.0 x 3.7 cm

_________________________________________________________

Estimated total number of nodules >/= 1 cm: 1

Number of spongiform nodules >/=  2 cm not described below (TR1): 0

Number of mixed cystic and solid nodules >/= 1.5 cm not described
below (TR2): 0

_________________________________________________________

Nodule # 1:

Prior biopsy: No

Location: Isthmus; Mid

Maximum size: 1.1 cm; Other 2 dimensions: 1.1 x 0.9 cm, previously,
1.1 x 1.1 x 0.9 cm

Composition: solid/almost completely solid (2)

Echogenicity: isoechoic (1)

Shape: not taller-than-wide (0)

Margins: ill-defined (0)

Echogenic foci: none (0)

ACR TI-RADS total points: 3.

ACR TI-RADS risk category:  TR3 (3 points).

Significant change in size (>/= 20% in two dimensions and minimal
increase of 2 mm): No

Change in features: No

Change in ACR TI-RADS risk category: No

ACR TI-RADS recommendations:

Given size (<1.4 cm) and appearance, this nodule does NOT meet
TI-RADS criteria for biopsy or dedicated follow-up.

_________________________________________________________

There is a punctate (approximately 0.6 cm) hypoechoic nodule within
the superior pole the right lobe of the thyroid which does not meet
imaging criteria to recommend percutaneous sampling or dedicated
follow-up

Previously biopsied ill-defined approximately 2.8 cm apparent nodule
within the mid aspect of the right lobe of the thyroid is less
conspicuous on the present examination and thus is favored to have
represented a pseudo nodule. Correlation with prior biopsy results
is recommended.

Previously biopsied 3.1 cm nodule/mass within the superior pole of
the left lobe of the thyroid is less conspicuous on the present
examination and thus favored to have represented a pseudo nodule.
Correlation prior biopsy results is recommended.
IMPRESSION: 1. Similar findings of multinodular goiter. No definitive new or
enlarging thyroid nodules.
2. Previously biopsied bilateral thyroid nodules are less
conspicuous on the present examination and may have represented
pseudo nodules. Correlation with prior biopsy results is
recommended. Assuming benign pathologic diagnosis, repeat sampling
and/or continued dedicated follow-up is not recommended.

The above is in keeping with the ACR TI-RADS recommendations - [HOSPITAL] 0864;[DATE].

## 2018-07-29 LAB — OB RESULTS CONSOLE HEPATITIS B SURFACE ANTIGEN
Hepatitis B Surface Ag: NEGATIVE
Hepatitis B Surface Ag: NEGATIVE

## 2018-07-29 LAB — OB RESULTS CONSOLE RUBELLA ANTIBODY, IGM: Rubella: IMMUNE

## 2018-07-29 LAB — OB RESULTS CONSOLE HIV ANTIBODY (ROUTINE TESTING): HIV: NONREACTIVE

## 2018-07-29 LAB — OB RESULTS CONSOLE ABO/RH: RH Type: POSITIVE

## 2018-07-29 LAB — OB RESULTS CONSOLE RPR: RPR: NONREACTIVE

## 2018-08-06 LAB — OB RESULTS CONSOLE GC/CHLAMYDIA
Chlamydia: NEGATIVE
Gonorrhea: NEGATIVE

## 2018-09-18 ENCOUNTER — Telehealth: Payer: Self-pay | Admitting: Internal Medicine

## 2018-09-18 NOTE — Telephone Encounter (Signed)
We only received page 3 of the fax

## 2018-09-18 NOTE — Telephone Encounter (Signed)
Will request it to be sent again.

## 2018-09-18 NOTE — Telephone Encounter (Signed)
Patient called to advise that she is in her 2nd trimester and her OB-GYN found elevated T3 during lab work at their office.  Patient states that the OB-GYN sent the results over to Dr Cruzita Lederer to schedule an appointment on or before 10/09/2018. Labwork was confirmed received.  I got her scheduled for tomorrow at 930 - open slot in schedule

## 2018-09-19 ENCOUNTER — Encounter: Payer: Self-pay | Admitting: Internal Medicine

## 2018-09-19 ENCOUNTER — Ambulatory Visit: Payer: BC Managed Care – PPO | Admitting: Internal Medicine

## 2018-09-19 ENCOUNTER — Other Ambulatory Visit: Payer: Self-pay

## 2018-09-19 VITALS — BP 130/80 | HR 104 | Ht 64.0 in | Wt 175.0 lb

## 2018-09-19 DIAGNOSIS — E0590 Thyrotoxicosis, unspecified without thyrotoxic crisis or storm: Secondary | ICD-10-CM | POA: Diagnosis not present

## 2018-09-19 DIAGNOSIS — E042 Nontoxic multinodular goiter: Secondary | ICD-10-CM | POA: Diagnosis not present

## 2018-09-19 DIAGNOSIS — N96 Recurrent pregnancy loss: Secondary | ICD-10-CM

## 2018-09-19 LAB — T4, FREE: Free T4: 0.55 ng/dL — ABNORMAL LOW (ref 0.60–1.60)

## 2018-09-19 LAB — TSH: TSH: 0.67 u[IU]/mL (ref 0.35–4.50)

## 2018-09-19 LAB — T3, FREE: T3, Free: 3.6 pg/mL (ref 2.3–4.2)

## 2018-09-19 NOTE — Progress Notes (Signed)
Patient ID: Meghan Jackson, female   DOB: 29-Apr-1980, 38 y.o.   MRN: [id number]    HPI  Meghan Jackson is a 38 y.o.-year-old female, returning for follow-up for thyroid nodules and also for a recently elevated T3 finding by OB/GYN.  Last visit 1.5 years ago.  Patient is pregnant in the second trimester.  At her latest visit with OB/GYN, TSH was normal but her T3 was elevated and she was referred back to endocrinology.  Unfortunately, I only have her TSH from 08/29/2018, and not her T3 value.  Patient thyroid nodules were discovered during investigation for infertility, when it was found that her thyroid was enlarged on palpation.  She has a history of 2 miscarriages and has had infertility treatment with Dr. Benjie Karvonen.  She was ovulating normally in the past and was on Clomid.  She was considering IVF at last visit.  Reviewed and addended her imaging test reports: Thyroid U/S (01/05/2016):  Isthmic 1.1 cm isoechoic nodule  Right mid 2.8 x 1.3 x 2.1 cm solid isoechoic nodule; few other small scattered cystic areas including a 0.6 cm predominately cystic nodule in the superior right lobe and a 0.5 cm partially cystic nodule in the inferior right lobe which are clearly low suspicion.  Left superior 3.1 x 1.9 x 3 cm solid, isoechoic, nodule  FNA of both dominant nodules (01/21/2016): Benign  Thyroid U/S (04/19/2017): Parenchymal Echotexture: Markedly heterogenous Isthmus: Enlarged measuring 0.6 cm in diameter, unchanged Right lobe: Enlarged measuring 7.2 x 3.2 x 3.8 cm, unchanged, previously, 6.9 x 3.3 x 3.9 cm Left lobe: Enlarged measuring 8.0 x 2.9 x 3.6 cm, unchanged, previously, 6.8 x 3.0 x 3.7 cm __________________________________________  Nodule # 1: Prior biopsy: No Location: Isthmus; Mid Maximum size: 1.1 cm; Other 2 dimensions: 1.1 x 0.9 cm, previously, 1.1 x 1.1 x 0.9 cm Composition: solid/almost completely solid (2) Echogenicity: isoechoic (1)  Given size (<1.4 cm) and appearance,  this nodule does NOT meet TI-RADS criteria for biopsy or dedicated follow-up. _________________________________________________________  There is a punctate (approximately 0.6 cm) hypoechoic nodule within the superior pole the right lobe of the thyroid which does not meet imaging criteria to recommend percutaneous sampling or dedicated follow-up  Previously biopsied ill-defined approximately 2.8 cm apparent nodule within the mid aspect of the right lobe of the thyroid is less conspicuous on the present examination and thus is favored to have represented a pseudo nodule. Correlation with prior biopsy results is recommended.  Previously biopsied 3.1 cm nodule/mass within the superior pole of the left lobe of the thyroid is less conspicuous on the present examination and thus favored to have represented a pseudo nodule. Correlation prior biopsy results is recommended.  IMPRESSION: 1. Similar findings of multinodular goiter. No definitive new or enlarging thyroid nodules. 2. Previously biopsied bilateral thyroid nodules are less conspicuous on the present examination and may have represented pseudo nodules. Correlation with prior biopsy results is recommended. Assuming benign pathologic diagnosis, repeat sampling and/or continued dedicated follow-up is not recommended.  Reviewed her TFTs: 08/29/2018: TSH 0.57 Lab Results  Component Value Date   TSH 1.00 04/05/2017   TSH 0.401 07/07/2013   FREET4 0.81 04/05/2017  12/25/2015: TSH 0.794, total T4 6.7, TPO antibodies 15 (0-34)  Pt denies: - feeling nodules in neck - hoarseness - dysphagia - choking - SOB with lying down  No FH of thyroid ds. No FH of thyroid cancer. No h/o radiation tx to head or neck.  No seaweed or kelp. No recent  contrast studies. No herbal supplements. No Biotin use. No recent steroids use.   Pt also has a history of sepsis from a boil.  ROS: Constitutional: + weight gain/no weight loss, no fatigue, no  subjective hyperthermia, no subjective hypothermia Eyes: no blurry vision, no xerophthalmia ENT: no sore throat, + see HPI Cardiovascular: no CP/no SOB/no palpitations/no leg swelling Respiratory: no cough/no SOB/no wheezing Gastrointestinal: no N/no V/no D/no C/no acid reflux Musculoskeletal: no muscle aches/no joint aches Skin: no rashes, no hair loss Neurological: no tremors/no numbness/no tingling/no dizziness  I reviewed pt's medications, allergies, PMH, social hx, family hx, and changes were documented in the history of present illness. Otherwise, unchanged from my initial visit note.  Past Medical History:  Diagnosis Date  . Anal fissure   . Hemorrhoids   . Irregular heart rhythm    No past surgical history on file. Social History   Social History  . Marital status: Married     Spouse name: N/A  . Number of children: 0   Occupational History  . Internal trainer   Social History Main Topics  . Smoking status: Never Smoker  . Smokeless tobacco: Never Used  . Alcohol use     1 Glasses of wine Every 2 months      Comment: occ  . Drug use: No  . Sexual activity: Yes   No current outpatient medications on file prior to visit.   No current facility-administered medications on file prior to visit.    Allergies  Allergen Reactions  . Latex Itching and Other (See Comments)    dryness   Family History  Problem Relation Age of Onset  . Kidney disease Father   . Cancer Maternal Grandmother    PE: BP 130/80   Pulse (!) 104   Ht 5\' 4"  (1.626 m)   Wt 175 lb (79.4 kg)   SpO2 98%   BMI 30.04 kg/m  Wt Readings from Last 3 Encounters:  09/19/18 175 lb (79.4 kg)  04/05/17 139 lb (63 kg)  04/05/16 150 lb (68 kg)   Constitutional: overweight, in NAD Eyes: PERRLA, EOMI, no exophthalmos ENT: moist mucous membranes, + symmetric thyromegaly, + nodule palpated in the central lower leg,no cervical lymphadenopathy Cardiovascular: tachycardia, RR, No MRG Respiratory: CTA  B Gastrointestinal: abdomen soft, NT, ND, BS+ Musculoskeletal: no deformities, strength intact in all 4 Skin: moist, warm, no rashes Neurological: no tremor with outstretched hands, DTR normal in all 4  ASSESSMENT: 1. Multiple thyroid nodules  2.   3. Recurrent miscarriages  PLAN: 1. Multiple thyroid nodules -Reviewed the report of the latest thyroid ultrasound and it does appear that her thyroid is slightly larger.  The 2 dominant nodules or enlarged, but isoechoic, without microcalcifications, and, on the new ultrasound, with the appearance of pseudo-nodules, which are signs of inflammation, therefore benign.  Moreover, she had biopsies of these 2 nodules and these were benign.  I would not suggest further follow-up for these.  There was 1 small nodule on the ultrasound from 2019 which was stable in size but did not have a pseudo-nodular appearance.  Due to the small size and the fact that it was isoechoic, no further investigation is needed for this, also -No neck compression symptoms, but her goiter is large and she notices/feels it -I will advise her to return to see me in 6 mo >> will recheck Thyroid U/S then  2.  Elevated T3 -Unfortunately, I do not have the records of this today -In the presence  of a normal TSH, this could be related to pregnancy and is not in itself pathologic finding -An elevated T3 could be due to elevated thyroid binding globulin (TBG) -we will check a free T3 today, however, even if this is elevated, this does not mandate any intervention, since treatment could cause more harm -I would like to check her thyroid antibodies today to screen for Hashimoto's thyroiditis or,  unlikely, Graves' disease  3. Recurrent miscarriages -At last visit, I reassured the patient that test miscarriages are not related to her thyroid as her TFTs have been normal. -A slightly high T3, in the presence of a normal TSH, it is extremely unlikely to cause any interference with the  pregnancy -Currently pregnant in the second trimester  Component     Latest Ref Rng & Units 09/19/2018  TSH     0.35 - 4.50 uIU/mL 0.67  T4,Free(Direct)     0.60 - 1.60 ng/dL 1.610.55 (L)  Triiodothyronine,Free,Serum     2.3 - 4.2 pg/mL 3.6  Thyroperoxidase Ab SerPl-aCnc     <9 IU/mL 1  Thyroglobulin Ab     < or = 1 IU/mL <1  TSI     <140 % baseline <89   Her free T3 is normal while the free T4 is slightly low with a normal TSH.  Thyroid antibodies are not elevated. No intervention is needed for now but we can repeat her TFTs in 1 month.  Carlus Pavlovristina Tavaria Mackins, MD PhD [facility]

## 2018-09-19 NOTE — Patient Instructions (Signed)
Please stop at the lab.  Please come back for a follow-up appointment in 6 months.  

## 2018-09-24 LAB — THYROGLOBULIN ANTIBODY: Thyroglobulin Ab: <1 IU/mL (ref <=1)

## 2018-09-24 LAB — THYROID PEROXIDASE ANTIBODY: Thyroperoxidase Ab SerPl-aCnc: 1 IU/mL (ref <9)

## 2018-09-24 LAB — THYROID STIMULATING IMMUNOGLOBULIN: TSI: <89 % baseline (ref <140)

## 2018-11-25 ENCOUNTER — Inpatient Hospital Stay (HOSPITAL_BASED_OUTPATIENT_CLINIC_OR_DEPARTMENT_OTHER): Payer: BC Managed Care – PPO

## 2018-11-25 ENCOUNTER — Encounter (HOSPITAL_COMMUNITY): Payer: Self-pay | Admitting: *Deleted

## 2018-11-25 ENCOUNTER — Inpatient Hospital Stay (HOSPITAL_COMMUNITY)
Admission: AD | Admit: 2018-11-25 | Discharge: 2018-11-25 | Disposition: A | Discharge status: Home / Self Care | Payer: BC Managed Care – PPO | Attending: Obstetrics and Gynecology | Admitting: Obstetrics and Gynecology

## 2018-11-25 ENCOUNTER — Other Ambulatory Visit: Payer: Self-pay

## 2018-11-25 DIAGNOSIS — O09522 Supervision of elderly multigravida, second trimester: Secondary | ICD-10-CM | POA: Diagnosis not present

## 2018-11-25 DIAGNOSIS — Z3A27 27 weeks gestation of pregnancy: Secondary | ICD-10-CM

## 2018-11-25 DIAGNOSIS — T1490XA Injury, unspecified, initial encounter: Secondary | ICD-10-CM | POA: Diagnosis not present

## 2018-11-25 DIAGNOSIS — O26892 Other specified pregnancy related conditions, second trimester: Secondary | ICD-10-CM | POA: Insufficient documentation

## 2018-11-25 DIAGNOSIS — W010XXA Fall on same level from slipping, tripping and stumbling without subsequent striking against object, initial encounter: Secondary | ICD-10-CM | POA: Insufficient documentation

## 2018-11-25 DIAGNOSIS — O321XX0 Maternal care for breech presentation, not applicable or unspecified: Secondary | ICD-10-CM | POA: Diagnosis not present

## 2018-11-25 DIAGNOSIS — W109XXA Fall (on) (from) unspecified stairs and steps, initial encounter: Secondary | ICD-10-CM | POA: Insufficient documentation

## 2018-11-25 DIAGNOSIS — W19XXXA Unspecified fall, initial encounter: Secondary | ICD-10-CM | POA: Diagnosis not present

## 2018-11-25 DIAGNOSIS — O9A212 Injury, poisoning and certain other consequences of external causes complicating pregnancy, second trimester: Secondary | ICD-10-CM | POA: Diagnosis not present

## 2018-11-25 DIAGNOSIS — Z7982 Long term (current) use of aspirin: Secondary | ICD-10-CM | POA: Diagnosis not present

## 2018-11-25 LAB — URINALYSIS, ROUTINE W REFLEX MICROSCOPIC
Bilirubin Urine: NEGATIVE
Glucose, UA: 150 mg/dL — AB
Hgb urine dipstick: NEGATIVE
Ketones, ur: NEGATIVE mg/dL
Nitrite: NEGATIVE
Protein, ur: NEGATIVE mg/dL
Specific Gravity, Urine: 1.018 (ref 1.005–1.030)
pH: 6 (ref 5.0–8.0)

## 2018-11-25 NOTE — Discharge Instructions (Signed)
Please have your doula email Marcille Buffy at [email or web address] to begin the application process for [facility] transition program.

## 2018-11-25 NOTE — MAU Provider Note (Addendum)
History     CSN: [id number]  Arrival date and time: 11/25/18 [id number]   First Provider Initiated Contact with Patient 11/25/18 1735      Chief Complaint  Patient presents with  . Fall   HPI  Ms.  Meghan Jackson is a 38 y.o. year old [id number] female at 6781w6d weeks gestation who presents to MAU reporting she slipped on carpet, landed on LT butt cheek and slid down stairs around 0530 or 0600 this AM. She denies hitting her abdomen or head, contractions, vaginal bleeding, or LOF. She only reports "some normal discomfort after falling on your butt" in the LT buttock area. She reports good (+) FM today. She called [facility] to report the fall and was told to come to MAU for evaluation.  Past Medical History:  Diagnosis Date  . Anal fissure   . Hemorrhoids   . Irregular heart rhythm     History reviewed. No pertinent surgical history.  Family History  Problem Relation Age of Onset  . Kidney disease Father   . Cancer Maternal Grandmother     Social History   Tobacco Use  . Smoking status: Never Smoker  . Smokeless tobacco: Never Used  Substance Use Topics  . Alcohol use: Yes    Alcohol/week: 2.0 standard drinks    Types: 1 Glasses of wine, 1 Shots of liquor per week    Comment: occ  . Drug use: No    Allergies:  Allergies  Allergen Reactions  . Latex Itching and Other (See Comments)    dryness    Medications Prior to Admission  Medication Sig Dispense Refill Last Dose  . aspirin 81 MG chewable tablet Chew by mouth daily.   11/24/2018 at Unknown time  . docusate sodium (COLACE) 100 MG capsule Take 100 mg by mouth 2 (two) times daily.   11/24/2018 at Unknown time  . Prenat-FeFum-FePo-FA-Omega 3 (CONCEPT DHA) 53.5-38-1 MG CAPS TK ONE C PO QD   11/24/2018 at Unknown time  . progesterone (PROMETRIUM) 200 MG capsule TK 1 C VAGINALLY D   11/24/2018 at Unknown time    Review of Systems  Constitutional: Negative.   HENT: Negative.   Eyes: Negative.   Respiratory: Negative.    Cardiovascular: Negative.   Gastrointestinal: Negative.   Endocrine: Negative.   Genitourinary: Negative.   Musculoskeletal: Positive for myalgias (in LT butt cheek).  Skin: Negative.   Allergic/Immunologic: Negative.   Neurological: Negative.   Hematological: Negative.   Psychiatric/Behavioral: Negative.    Physical Exam   Blood pressure 121/68, pulse (!) 102, temperature 98.5 F (36.9 C), temperature source Oral, resp. rate 18, height 5\' 4"  (1.626 m), weight 88.7 kg, SpO2 100 %.  Physical Exam  Nursing note and vitals reviewed. Constitutional: She is oriented to person, place, and time. She appears well-developed and well-nourished.  HENT:  Head: Normocephalic and atraumatic.  Eyes: Pupils are equal, round, and reactive to light.  Neck: Normal range of motion.  Cardiovascular: Normal rate.  Respiratory: Effort normal.  GI: Soft.  Musculoskeletal: Normal range of motion.  Neurological: She is alert and oriented to person, place, and time. She has normal reflexes.  Skin: Skin is warm and dry.  Psychiatric: She has a normal mood and affect. Her behavior is normal. Judgment and thought content normal.   NST - FHR: 155 bpm / moderate variability / 10 x 10 accels present / decels absent / TOCO: none Reassessment of NST @ 1930 - FHR: 150 bpm / moderate variability /  10 x 10 accels present / decels absent / TOCO: none   MAU Course  Procedures  MDM CEFM OB MFM Limited U/S: no evidence of placental abruption  AFI WNL  frank breech presentation  posterior succenturiate lobe   *Consult with Dr. Elonda Husky @ 1905 - notified of patient's complaints, assessments, lab & U/S results, tx plan d/c home with bleeding and PTL precautions - ok to d/c home, agrees with plan   Assessment and Plan  Traumatic injury during pregnancy in second trimester   - Information provided on preventing injuries during pregnancy, placental abruption & FKC - Reviewed bleeding precautions and when to return to  MAU - Advised to monitor fetal movements and log movements - Advised to call [facility] with any further questions or concerns - Discharge home - Patient verbalized an understanding of the plan of care and agrees.    Laury Deep, MSN, CNM 11/25/2018, 5:36 PM

## 2018-11-25 NOTE — MAU Note (Signed)
Slipped on the carpet and fell down the stairs this morning, landed on left butt cheek and slid down the steps.  Did not hit abd. No bleeding or leaking. No abd pain. Denies any unusual pain.  Baby moving well.

## 2018-12-13 LAB — OB RESULTS CONSOLE GBS: GBS: NEGATIVE

## 2018-12-31 ENCOUNTER — Telehealth: Payer: Self-pay

## 2018-12-31 ENCOUNTER — Other Ambulatory Visit: Payer: Self-pay

## 2018-12-31 ENCOUNTER — Encounter: Payer: Self-pay | Admitting: Internal Medicine

## 2018-12-31 NOTE — Telephone Encounter (Signed)
Patient called in wanting to know if she needs to make appt her OB stated her TSH was normal but FreeT4 was low  Patient wants to know if she needs to see Dr. Jaymes Graff if she ok to not be seen at this time   Please call and advise

## 2018-12-31 NOTE — Telephone Encounter (Signed)
Patient ph# [phone] called re; waiting for response from Dr. Carmon Sails from previous phone call. Patient requests to be called at the ph# listed above.

## 2018-12-31 NOTE — Telephone Encounter (Signed)
I would like to see the results first.

## 2018-12-31 NOTE — Telephone Encounter (Signed)
Duplicate encounter created in error, please see open note regarding this issue.

## 2018-12-31 NOTE — Telephone Encounter (Signed)
Have you seen results for this or do I need to get a copy?

## 2019-01-01 NOTE — Telephone Encounter (Signed)
I reviewed the results obtained on 12/26/2018: TSH 1.24 Free T4 0.70 (0.82-1.77).  Since the TSH is at goal for pregnancy, I would continue the current dose of levothyroxine.  The free T4 was low most likely due to the fact that it was collected towards the end of the day (the free T4 will decrease throughout the day depending on the time passed since taking levothyroxine).

## 2019-01-01 NOTE — Telephone Encounter (Signed)
Report received and given to Dr. Cruzita Lederer.

## 2019-01-01 NOTE — Telephone Encounter (Signed)
I spoke to patient and relayed the below information, she states she was never put on thyroid medication.  Patient has appointment or tomorrow with [place].

## 2019-01-01 NOTE — Telephone Encounter (Signed)
Contacted patient to let her know we have not received those results, she will call her OB now and have them fax.

## 2019-01-02 ENCOUNTER — Ambulatory Visit (INDEPENDENT_AMBULATORY_CARE_PROVIDER_SITE_OTHER): Payer: BC Managed Care – PPO | Admitting: Internal Medicine

## 2019-01-02 ENCOUNTER — Encounter: Payer: Self-pay | Admitting: Internal Medicine

## 2019-01-02 ENCOUNTER — Other Ambulatory Visit: Payer: Self-pay

## 2019-01-02 VITALS — BP 110/60 | HR 117 | Ht 64.0 in | Wt 205.0 lb

## 2019-01-02 DIAGNOSIS — E042 Nontoxic multinodular goiter: Secondary | ICD-10-CM | POA: Diagnosis not present

## 2019-01-02 DIAGNOSIS — N96 Recurrent pregnancy loss: Secondary | ICD-10-CM | POA: Diagnosis not present

## 2019-01-02 DIAGNOSIS — E0590 Thyrotoxicosis, unspecified without thyrotoxic crisis or storm: Secondary | ICD-10-CM

## 2019-01-02 NOTE — Progress Notes (Signed)
Patient ID: Meghan Jackson, female   DOB: 28-Apr-1980, 38 y.o.   MRN: [id number]    HPI  Meghan Jackson is a 38 y.o.-year-old female, returning for follow-up for thyroid nodules and also for history of elevated T3 and low free T4.  Last visit 3 months ago.  She is pregnant in the third trimester (33 weeks).  EDD: Dec 8th. She will have a boy. She has a history of 2 miscarriages and had infertility treatment with Dr. Juliene Pina.  She was ovulating normally in the past and was on Clomid.  Before last visit, after latest visit with OB/GYN, TSH was normal but her T3 was elevated and she was referred back to endocrinology.  Latest labs were from earlier this month and this showed a normal TSH and a slightly low free T4  Patient has a history of thyroid nodules that were discovered incidentally during the investigation for infertility.  At that time felt to be enlarged on palpation.  Reviewed her imaging test reports and FNA results: Thyroid U/S (01/05/2016):  Isthmic 1.1 cm isoechoic nodule  Right mid 2.8 x 1.3 x 2.1 cm solid isoechoic nodule; few other small scattered cystic areas including a 0.6 cm predominately cystic nodule in the superior right lobe and a 0.5 cm partially cystic nodule in the inferior right lobe which are clearly low suspicion.  Left superior 3.1 x 1.9 x 3 cm solid, isoechoic, nodule  FNA of both dominant nodules (01/21/2016): Benign  Thyroid U/S (04/19/2017): Parenchymal Echotexture: Markedly heterogenous Isthmus: Enlarged measuring 0.6 cm in diameter, unchanged Right lobe: Enlarged measuring 7.2 x 3.2 x 3.8 cm, unchanged, previously, 6.9 x 3.3 x 3.9 cm Left lobe: Enlarged measuring 8.0 x 2.9 x 3.6 cm, unchanged, previously, 6.8 x 3.0 x 3.7 cm __________________________________________  Nodule # 1: Prior biopsy: No Location: Isthmus; Mid Maximum size: 1.1 cm; Other 2 dimensions: 1.1 x 0.9 cm, previously, 1.1 x 1.1 x 0.9 cm Composition: solid/almost completely solid  (2) Echogenicity: isoechoic (1)  Given size (<1.4 cm) and appearance, this nodule does NOT meet TI-RADS criteria for biopsy or dedicated follow-up. _________________________________________________________  There is a punctate (approximately 0.6 cm) hypoechoic nodule within the superior pole the right lobe of the thyroid which does not meet imaging criteria to recommend percutaneous sampling or dedicated follow-up  Previously biopsied ill-defined approximately 2.8 cm apparent nodule within the mid aspect of the right lobe of the thyroid is less conspicuous on the present examination and thus is favored to have represented a pseudo nodule. Correlation with prior biopsy results is recommended.  Previously biopsied 3.1 cm nodule/mass within the superior pole of the left lobe of the thyroid is less conspicuous on the present examination and thus favored to have represented a pseudo nodule. Correlation prior biopsy results is recommended.  IMPRESSION: 1. Similar findings of multinodular goiter. No definitive new or enlarging thyroid nodules. 2. Previously biopsied bilateral thyroid nodules are less conspicuous on the present examination and may have represented pseudo nodules. Correlation with prior biopsy results is recommended. Assuming benign pathologic diagnosis, repeat sampling and/or continued dedicated follow-up is not recommended.  Pt denies: - feeling nodules in neck - hoarseness - dysphagia - choking - SOB with lying down  Reviewed patient's TFTs: 12/26/2018: TSH 1.24, Free T4 0.70 (0.82-1.77). Lab Results  Component Value Date   TSH 0.67 09/19/2018   TSH 1.00 04/05/2017   TSH 0.401 07/07/2013   FREET4 0.55 (L) 09/19/2018   FREET4 0.81 04/05/2017   Lab Results  Component Value Date   T3FREE 3.6 09/19/2018   T3FREE 4.1 04/05/2017  08/29/2018: TSH 0.57 12/25/2015: TSH 0.794, total T4 6.7  Her thyroid antibodies were not elevated Component     Latest Ref Rng  & Units 09/19/2018  Thyroperoxidase Ab SerPl-aCnc     <9 IU/mL 1  Thyroglobulin Ab     < or = 1 IU/mL <1  TSI     <140 % baseline <89  12/25/2015: TPO antibodies 15 (0-34)  No FH of thyroid ds. No FH of thyroid cancer. No h/o radiation tx to head or neck.  No seaweed or kelp. No recent contrast studies. No herbal supplements. No Biotin use. No recent steroids use.   Pt also has a history of sepsis from a boil.  ROS: Constitutional: + Weight gain, no fatigue, no subjective hyperthermia, no subjective hypothermia Eyes: no blurry vision, no xerophthalmia ENT: no sore throat, + see HPI Cardiovascular: no CP/no SOB/no palpitations/no leg swelling Respiratory: no cough/no SOB/no wheezing Gastrointestinal: no N/no V/no D/no C/no acid reflux Musculoskeletal: no muscle aches/no joint aches Skin: no rashes, no hair loss Neurological: no tremors/no numbness/no tingling/no dizziness  I reviewed pt's medications, allergies, PMH, social hx, family hx, and changes were documented in the history of present illness. Otherwise, unchanged from my initial visit note.  Past Medical History:  Diagnosis Date  . Anal fissure   . Hemorrhoids   . Irregular heart rhythm    No past surgical history on file. Social History   Social History  . Marital status: Married     Spouse name: N/A  . Number of children: 0   Occupational History  . Internal trainer   Social History Main Topics  . Smoking status: Never Smoker  . Smokeless tobacco: Never Used  . Alcohol use     1 Glasses of wine Every 2 months      Comment: occ  . Drug use: No  . Sexual activity: Yes   Current Outpatient Medications on File Prior to Visit  Medication Sig Dispense Refill  . aspirin 81 MG chewable tablet Chew by mouth daily.    Marland Kitchen. docusate sodium (COLACE) 100 MG capsule Take 100 mg by mouth 2 (two) times daily.    . Prenat-FeFum-FePo-FA-Omega 3 (CONCEPT DHA) 53.5-38-1 MG CAPS TK ONE C PO QD    . progesterone (PROMETRIUM)  200 MG capsule TK 1 C VAGINALLY D     No current facility-administered medications on file prior to visit.    Allergies  Allergen Reactions  . Latex Itching and Other (See Comments)    dryness   Family History  Problem Relation Age of Onset  . Kidney disease Father   . Cancer Maternal Grandmother    PE: BP 110/60   Pulse (!) 117   Ht 5\' 4"  (1.626 m)   Wt 205 lb (93 kg)   SpO2 98%   BMI 35.19 kg/m  Wt Readings from Last 3 Encounters:  01/02/19 205 lb (93 kg)  11/25/18 195 lb 9.6 oz (88.7 kg)  09/19/18 175 lb (79.4 kg)   Constitutional: Pregnant appearing , in NAD Eyes: PERRLA, EOMI, no exophthalmos ENT: moist mucous membranes, + symmetric significant thyromegaly, + nodule palpated in central lower neck no cervical lymphadenopathy Cardiovascular: tachycardia, RR, No MRG, + LE swelling - nonpitting Respiratory: CTA B Gastrointestinal: abdomen soft, NT, ND, BS+ Musculoskeletal: no deformities, strength intact in all 4 Skin: moist, warm, no rashes Neurological: no tremor with outstretched hands, DTR normal in all  4  ASSESSMENT: 1. Multiple thyroid nodules  2. Abnormal free thyroid hormones  3. Recurrent miscarriages  PLAN: 1. Multiple thyroid nodules -Reviewed the report of the latest thyroid ultrasound and it does appear that her thyroid is slightly larger.  The 2 dominant nodules are not enlarged, but isoechoic, without microcalcifications, and, on the new ultrasound, they appear as pseudonodules, which are signs of inflammation, therefore benign.  Moreover, she had biopsies of these 2 nodules and these were benign.  No follow-up is necessary for these.  There was one small nodule on the ultrasound from 2019 which was stable in size but did not have a pseudonodular appearance.  Since this was small and isoechoic, no further investigation is needed for this, also -No neck compression symptoms, but she does notice the goiter, which is quite large -At last visit we discussed  about returning to see me in 6 months to check another ultrasound.  She now returns after 3 months.  I will order the new ultrasound at next visit, after her pregnancy.  2.  Abnormal free thyroid hormones -Patient has a history of elevated T3, but more recently she had a slightly low free T4. -We discussed that in the presence of a normal TSH, a slightly low free T4 could be related to pregnancy or 2 variation in the assay and it is not necessarily a pathologic finding.  In patients that are taking levothyroxine, this is most likely related to the timing of the dose.  However, she is not on levothyroxine but I would not suggest to start based on the above finding. -Of note, previous thyroid antibodies have not been elevated to point towards Hashimoto's thyroiditis.  3. Recurrent miscarriages -She is currently pregnant in third trimester -I reassured her that her mildly abnormal thyroid tests are deleterious for the current pregnancy.  Philemon Kingdom, MD PhD [facility] Endocrin[facility]

## 2019-01-02 NOTE — Patient Instructions (Signed)
  Please come back for a follow-up appointment in January (already scheduled).

## 2019-01-07 ENCOUNTER — Other Ambulatory Visit: Payer: Self-pay

## 2019-01-07 ENCOUNTER — Encounter (HOSPITAL_COMMUNITY): Payer: Self-pay | Admitting: *Deleted

## 2019-01-07 ENCOUNTER — Inpatient Hospital Stay (HOSPITAL_COMMUNITY)
Admission: AD | Admit: 2019-01-07 | Discharge: 2019-01-08 | Disposition: A | Discharge status: Home / Self Care | Payer: BC Managed Care – PPO | Attending: Obstetrics and Gynecology | Admitting: Obstetrics and Gynecology

## 2019-01-07 ENCOUNTER — Other Ambulatory Visit: Payer: Self-pay | Admitting: Certified Nurse Midwife

## 2019-01-07 DIAGNOSIS — Z7982 Long term (current) use of aspirin: Secondary | ICD-10-CM | POA: Insufficient documentation

## 2019-01-07 DIAGNOSIS — Z3A34 34 weeks gestation of pregnancy: Secondary | ICD-10-CM | POA: Diagnosis not present

## 2019-01-07 DIAGNOSIS — Z3689 Encounter for other specified antenatal screening: Secondary | ICD-10-CM | POA: Diagnosis not present

## 2019-01-07 DIAGNOSIS — K645 Perianal venous thrombosis: Secondary | ICD-10-CM

## 2019-01-07 DIAGNOSIS — O2243 Hemorrhoids in pregnancy, third trimester: Secondary | ICD-10-CM | POA: Diagnosis present

## 2019-01-07 DIAGNOSIS — K6289 Other specified diseases of anus and rectum: Secondary | ICD-10-CM | POA: Diagnosis not present

## 2019-01-07 DIAGNOSIS — Z8719 Personal history of other diseases of the digestive system: Secondary | ICD-10-CM | POA: Insufficient documentation

## 2019-01-07 DIAGNOSIS — Z79899 Other long term (current) drug therapy: Secondary | ICD-10-CM | POA: Diagnosis not present

## 2019-01-07 DIAGNOSIS — O26893 Other specified pregnancy related conditions, third trimester: Secondary | ICD-10-CM | POA: Insufficient documentation

## 2019-01-07 LAB — URINALYSIS, ROUTINE W REFLEX MICROSCOPIC
Bilirubin Urine: NEGATIVE
Glucose, UA: NEGATIVE mg/dL
Ketones, ur: 20 mg/dL — AB
Nitrite: NEGATIVE
Protein, ur: 100 mg/dL — AB
Specific Gravity, Urine: 1.026 (ref 1.005–1.030)
WBC, UA: >50 WBC/hpf — ABNORMAL HIGH (ref 0–5)
pH: 5 (ref 5.0–8.0)

## 2019-01-07 MED ORDER — BUTORPHANOL TARTRATE 1 MG/ML IJ SOLN
2.0000 mg | Freq: Once | INTRAMUSCULAR | Status: AC
  Administered 2019-01-07: 21:00:00 2 mg via INTRAMUSCULAR
  Filled 2019-01-07: qty 2

## 2019-01-07 MED ORDER — NITROGLYCERIN 2 % TD OINT
0.5000 [in_us] | TOPICAL_OINTMENT | Freq: Once | TRANSDERMAL | Status: AC
  Administered 2019-01-07: 22:00:00 0.5 [in_us] via TOPICAL
  Filled 2019-01-07: qty 30

## 2019-01-07 NOTE — MAU Note (Signed)
Pt reports to MAU reporting thrombosed hemorrhoids pt reports the pain started last night. Pt has a hx of anal fissures and hemorrhoids. Pt is also having intermittent back pain. No bleeding or LOF. +FM.   Pt used Preparation H. Sitz, and witch hazel with no relief. Pt reports it is throbbing.   Pt took rectal medication at 1330 but the pain was unbearable and she came to MAU

## 2019-01-08 ENCOUNTER — Other Ambulatory Visit: Payer: Self-pay | Admitting: Certified Nurse Midwife

## 2019-01-08 DIAGNOSIS — Z3A34 34 weeks gestation of pregnancy: Secondary | ICD-10-CM

## 2019-01-08 DIAGNOSIS — K645 Perianal venous thrombosis: Secondary | ICD-10-CM

## 2019-01-08 DIAGNOSIS — Z3689 Encounter for other specified antenatal screening: Secondary | ICD-10-CM

## 2019-01-08 DIAGNOSIS — O2243 Hemorrhoids in pregnancy, third trimester: Secondary | ICD-10-CM

## 2019-01-08 MED ORDER — OXYCODONE-ACETAMINOPHEN 5-325 MG PO TABS: 1.0000 | ORAL_TABLET | Freq: Four times a day (QID) | ORAL | 0 refills | Status: DC | PRN

## 2019-01-08 NOTE — MAU Provider Note (Signed)
History     CSN: [id number]  Arrival date and time: 01/07/19 [id number]   First Provider Initiated Contact with Patient 01/07/19 2042      Chief Complaint  Patient presents with  . Hemorrhoids   Meghan Jackson is a 38 y.o. G4P0 at 4394w1d who presents to MAU with complaints of hemorrhoids. She reports hx of hemorrhoids and anal fissure in past but denies complication of thrombosed hemorrhoid during this pregnancy until yesterday. Reports she is "usually able to push the hemorrhoid back in but couldn't". She reports excruciating pain that started yesterday and continue to get worse over the course of today. Describes the pain as sharp throbbing sensation and aching, rates pain 20 out of 10. Patient reports it being hard for her to sit down. Has taken Tylenol for pain without relief. Last BM 2 days ago.   She denies pregnancy complaints. She denies abdominal pain, cramping, contractions, vaginal bleeding, discharge or LOF. Reports +FM. [facility] next appointment on Friday.   OB History    Gravida  4   Para      Term      Preterm      AB  3   Living        SAB  3   TAB      Ectopic      Multiple      Live Births              Past Medical History:  Diagnosis Date  . Anal fissure   . Hemorrhoids   . Irregular heart rhythm     Past Surgical History:  Procedure Laterality Date  . NO PAST SURGERIES      Family History  Problem Relation Age of Onset  . Kidney disease Father   . Cancer Maternal Grandmother     Social History   Tobacco Use  . Smoking status: Never Smoker  . Smokeless tobacco: Never Used  Substance Use Topics  . Alcohol use: Yes    Alcohol/week: 2.0 standard drinks    Types: 1 Glasses of wine, 1 Shots of liquor per week    Comment: occ  . Drug use: No    Allergies:  Allergies  Allergen Reactions  . Latex Itching and Other (See Comments)    dryness    Medications Prior to Admission  Medication Sig Dispense  Refill Last Dose  . acetaminophen (TYLENOL) 500 MG tablet Take 500 mg by mouth every 6 (six) hours as needed.   01/07/2019 at Unknown time  . aspirin 81 MG chewable tablet Chew by mouth daily.   01/07/2019 at Unknown time  . docusate sodium (COLACE) 100 MG capsule Take 100 mg by mouth 2 (two) times daily.   01/07/2019 at Unknown time  . Pramoxine-HC (HC PRAMOXINE EX) Apply topically.   01/07/2019 at Unknown time  . Prenat-FeFum-FePo-FA-Omega 3 (CONCEPT DHA) 53.5-38-1 MG CAPS TK ONE C PO QD   01/07/2019 at Unknown time  . progesterone (PROMETRIUM) 200 MG capsule TK 1 C VAGINALLY D   01/06/2019 at Unknown time    Review of Systems  Constitutional: Negative.   Respiratory: Negative.   Cardiovascular: Negative.   Gastrointestinal: Positive for rectal pain. Negative for abdominal pain, anal bleeding, blood in stool, constipation, diarrhea and nausea.       Hemorrhoid   Genitourinary: Negative.   Musculoskeletal: Negative.   Neurological: Negative.    Physical Exam   Blood pressure (!) 112/58, pulse (!) 112, temperature  98.4 F (36.9 C), temperature source Oral, resp. rate 20, SpO2 99 %.  Physical Exam  Nursing note and vitals reviewed. Constitutional: She is oriented to person, place, and time. She appears well-developed and well-nourished. She appears distressed.  Distressed d/t pain, patient is shaking and gripping rails of bed  Cardiovascular: Normal rate and regular rhythm.  Respiratory: Effort normal and breath sounds normal. No respiratory distress. She has no wheezes.  GI: Soft. There is no abdominal tenderness. There is no rebound and no guarding.  Gravid appropriate for gestational age  Genitourinary: Rectum:     Tenderness and external hemorrhoid present.      Musculoskeletal:        General: No edema.  Neurological: She is alert and oriented to person, place, and time.  Psychiatric: She has a normal mood and affect. Her behavior is normal. Thought content normal.    MAU Course  Procedures  MDM NST reactive and reassuring prior to medication being given  FHR: 150/moderate/+accels/ no decelerations, no UC on TOCO   2mg  stadol IM given for rectal pain  C/w Dr Nehemiah Settle - plan of care Nitroglycerin ointment for hemorrhoid   MAU observation after medication given   Upon reassessment- patient reports pain is 1/10 and patient able to rest in room, hemorrhoid non reducible, not bleeding.  Consult to [facility] to discuss if they excise thrombosed hemorrhoids in hospital- they do not and recommend referral to general surgery.   Discussed plan of care with patient, patient's partner not in the room - will discuss plan of care with partner once he returns.   Reassessment @2330  once partner returned  FHR: 150/ minimal to mod/+accels/ 1 prolonged decel @2302 , none after   Discussed plan of care with partner and patient. Answered partner's questions. Reviewed medication for home use. Instructed to call central [place] surgery in the morning to schedule assessment. Follow up as scheduled in the office for prenatal care. Warning signs discussed on reasons to return to MAU. Rx for percocet sent to pharmacy of choice- short dose of percocet given to patient.   Continued monitoring for 1 hour d/t one deceleration at 2302- no other decelerations noted upon review of tracing at 0000.Marland Kitchen NST reassuring. Dr Nehemiah Settle reviewed tracing and is okay with discharge home. Pt stable at time of discharge.   Assessment and Plan   1. External thrombosed hemorrhoids   2. [redacted] weeks gestation of pregnancy   3. NST (non-stress test) reactive    Discharge home Follow up as scheduled in the office for prenatal care Return to MAU as needed for reasons discussed and/or emergencies  Call to make appointment with [facility] surgery- order for consult placed.  Rx for percocet sent to pharmacy of choice   Follow-up [place]. Go on 01/10/2019.    Why: Follow up as scheduled on Friday for prenatal care Contact information: [address] [id number] [phone]       Surgery, [place]. Call.   Specialty: General Surgery Why: Call in morning to schedule excision of hemorrhoids  Contact information: [address] [place] [address] [phone]        Tresea Mall, CNM Follow up.   Specialty: Obstetrics and Gynecology Why: Contact heather for discuss doula services and get doula approved to attend delivery  Contact information: [place] [address] [phone]          Allergies  as of 01/08/2019      Reactions   Latex Itching, Other (See Comments)   dryness      Medication List    TAKE these medications   acetaminophen 500 MG tablet Commonly known as: TYLENOL Take 500 mg by mouth every 6 (six) hours as needed.   aspirin 81 MG chewable tablet Chew by mouth daily.   Concept DHA 53.5-38-1 MG Caps TK ONE C PO QD   docusate sodium 100 MG capsule Commonly known as: COLACE Take 100 mg by mouth 2 (two) times daily.   HC PRAMOXINE EX Apply topically.   oxyCODONE-acetaminophen 5-325 MG tablet Commonly known as: PERCOCET/ROXICET Take 1-2 tablets by mouth every 6 (six) hours as needed for severe pain.   progesterone 200 MG capsule Commonly known as: PROMETRIUM TK 1 C VAGINALLY D       Sharyon Cable CNM 01/08/2019, 12:31 AM

## 2019-01-27 LAB — OB RESULTS CONSOLE GBS: GBS: NEGATIVE

## 2019-01-30 ENCOUNTER — Other Ambulatory Visit: Payer: Self-pay

## 2019-01-30 ENCOUNTER — Inpatient Hospital Stay (HOSPITAL_COMMUNITY)
Admission: AD | Admit: 2019-01-30 | Discharge: 2019-02-02 | DRG: 807 | Disposition: A | Discharge status: Home / Self Care | Payer: BC Managed Care – PPO | Attending: Obstetrics and Gynecology | Admitting: Obstetrics and Gynecology

## 2019-01-30 ENCOUNTER — Encounter (HOSPITAL_COMMUNITY): Payer: Self-pay | Admitting: *Deleted

## 2019-01-30 DIAGNOSIS — Z20828 Contact with and (suspected) exposure to other viral communicable diseases: Secondary | ICD-10-CM | POA: Diagnosis present

## 2019-01-30 DIAGNOSIS — O1490 Unspecified pre-eclampsia, unspecified trimester: Secondary | ICD-10-CM | POA: Diagnosis present

## 2019-01-30 DIAGNOSIS — K644 Residual hemorrhoidal skin tags: Secondary | ICD-10-CM | POA: Diagnosis present

## 2019-01-30 DIAGNOSIS — O1414 Severe pre-eclampsia complicating childbirth: Principal | ICD-10-CM | POA: Diagnosis present

## 2019-01-30 DIAGNOSIS — Z7982 Long term (current) use of aspirin: Secondary | ICD-10-CM

## 2019-01-30 DIAGNOSIS — O2243 Hemorrhoids in pregnancy, third trimester: Secondary | ICD-10-CM | POA: Diagnosis present

## 2019-01-30 DIAGNOSIS — Z3A37 37 weeks gestation of pregnancy: Secondary | ICD-10-CM | POA: Diagnosis not present

## 2019-01-30 DIAGNOSIS — O9A212 Injury, poisoning and certain other consequences of external causes complicating pregnancy, second trimester: Secondary | ICD-10-CM

## 2019-01-30 LAB — TYPE AND SCREEN
ABO/RH(D): O POS
Antibody Screen: NEGATIVE

## 2019-01-30 MED ORDER — LIDOCAINE HCL (PF) 1 % IJ SOLN: 30.0000 mL | INTRAMUSCULAR | Status: DC | PRN

## 2019-01-30 MED ORDER — LABETALOL HCL 5 MG/ML IV SOLN: 20.0000 mg | INTRAVENOUS | Status: DC | PRN

## 2019-01-30 MED ORDER — OXYCODONE-ACETAMINOPHEN 5-325 MG PO TABS: 1.0000 | ORAL_TABLET | ORAL | Status: DC | PRN

## 2019-01-30 MED ORDER — ACETAMINOPHEN 325 MG PO TABS: 650.0000 mg | ORAL_TABLET | ORAL | Status: DC | PRN

## 2019-01-30 MED ORDER — OXYCODONE-ACETAMINOPHEN 5-325 MG PO TABS: 2.0000 | ORAL_TABLET | ORAL | Status: DC | PRN

## 2019-01-30 MED ORDER — LACTATED RINGERS IV SOLN
500.0000 mL | INTRAVENOUS | Status: DC | PRN
  Administered 2019-01-31: 300 mL via INTRAVENOUS
  Administered 2019-01-31: 1000 mL via INTRAVENOUS

## 2019-01-30 MED ORDER — LACTATED RINGERS IV SOLN
INTRAVENOUS | Status: DC
  Administered 2019-01-31 (×3): via INTRAVENOUS

## 2019-01-30 MED ORDER — MISOPROSTOL 25 MCG QUARTER TABLET
25.0000 ug | ORAL_TABLET | ORAL | Status: DC | PRN
  Administered 2019-01-30: 25 ug via VAGINAL
  Filled 2019-01-30: qty 1

## 2019-01-30 MED ORDER — OXYTOCIN 40 UNITS IN NORMAL SALINE INFUSION - SIMPLE MED
1.0000 m[IU]/min | INTRAVENOUS | Status: DC
  Filled 2019-01-30: qty 1000

## 2019-01-30 MED ORDER — OXYTOCIN 40 UNITS IN NORMAL SALINE INFUSION - SIMPLE MED
2.5000 [IU]/h | INTRAVENOUS | Status: DC
  Administered 2019-02-01: 01:00:00 2.5 [IU]/h via INTRAVENOUS

## 2019-01-30 MED ORDER — OXYTOCIN BOLUS FROM INFUSION
500.0000 mL | Freq: Once | INTRAVENOUS | Status: AC
  Administered 2019-02-01: 500 mL via INTRAVENOUS

## 2019-01-30 MED ORDER — HYDRALAZINE HCL 20 MG/ML IJ SOLN: 10.0000 mg | INTRAMUSCULAR | Status: DC | PRN

## 2019-01-30 MED ORDER — NITROFURANTOIN MONOHYD MACRO 100 MG PO CAPS: 100.0000 mg | ORAL_CAPSULE | Freq: Two times a day (BID) | ORAL | Status: DC

## 2019-01-30 MED ORDER — LABETALOL HCL 5 MG/ML IV SOLN: 40.0000 mg | INTRAVENOUS | Status: DC | PRN

## 2019-01-30 MED ORDER — TERBUTALINE SULFATE 1 MG/ML IJ SOLN: 0.2500 mg | Freq: Once | INTRAMUSCULAR | Status: DC | PRN

## 2019-01-30 MED ORDER — SOD CITRATE-CITRIC ACID 500-334 MG/5ML PO SOLN: 30.0000 mL | ORAL | Status: DC | PRN

## 2019-01-30 MED ORDER — LABETALOL HCL 5 MG/ML IV SOLN: 80.0000 mg | INTRAVENOUS | Status: DC | PRN

## 2019-01-30 MED ORDER — ONDANSETRON HCL 4 MG/2ML IJ SOLN: 4.0000 mg | Freq: Four times a day (QID) | INTRAMUSCULAR | Status: DC | PRN

## 2019-01-30 NOTE — MAU Note (Signed)
MAU presents to MAU for evaluation pt was sent in from the office for a IOL due to a increase in PCR. Pt reports she had a membrane sweep yesterday. No bleeding or LOF. +FM. No HA, blurry vision, or RUQ pain.

## 2019-01-30 NOTE — H&P (Addendum)
Meghan Jackson is a 38 y.o. female, G4P0030, IUP at 37.2 weeks, presenting for IOL due to preeclampsia with severe features, pt has been asymptomatic and normotensive, but on 11/17 PCR was 0.6, was dx with UTI and treated with Macrobid, denies dysuria, pt had labs done today, resulted PCR 0.334 and creatinine was 1.36, Plat 156, hgb 12.4, slightly elevated LFTs 20/38, denies HA, RUQ pain or vision changes. Pt endorse + Fm. Denies vaginal leakage. Denies vaginal bleeding. Denies feeling cxt's.   Pregnancy Problems advanced maternal age gravida (38yo; Panorama low risk) anal fissure (With abscess and sepsis 2014, I&D) cardiac arrhythmia (In past, negative cardiac w/u.) Goiter (Fine Needle Biopsy 2017 -benign, TSH was 0.7 on 10/14) recurrent miscarriage (X 2. Seen by fertility specialist_Dr. April Manson 06/25/2018. Started vaginal progesterone / Baby ASA 5/18)  Medications Adult Low Dose Aspirin DOK hydrocortisone-pramoxine lidocaine metoclopramide HCl nitrofurantoin monohyd/m-cryst oxycodone-acetaminophen Prenatal Multivitamins Proctofoam HC progesterone micronized Virt-C DHA  Allergy to latex   Patient Active Problem List   Diagnosis Date Noted  . Traumatic injury during pregnancy in second trimester 11/25/2018  . 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 Multinodular goiter (nontoxic) 04/05/2016  . Dizziness 10/22/2013  . Unspecified episodic mood disorder 10/22/2013  . Abdominal pain, left upper quadrant 07/07/2013  . Preventative health care 07/07/2013     Medications Prior to Admission  Medication Sig Dispense Refill Last Dose  . acetaminophen (TYLENOL) 500 MG tablet Take 500 mg by mouth every 6 (six) hours as needed.   01/29/2019 at Unknown time  . aspirin 81 MG chewable tablet Chew by mouth daily.   01/29/2019 at Unknown time  . docusate sodium (COLACE) 100 MG capsule Take 100 mg by mouth 2 (two) times daily.   01/29/2019 at Unknown time  . Pramoxine-HC (HC  PRAMOXINE EX) Apply topically.   01/29/2019 at Unknown time  . Prenat-FeFum-FePo-FA-Omega 3 (CONCEPT DHA) 53.5-38-1 MG CAPS TK ONE C PO QD   01/29/2019 at Unknown time  . oxyCODONE-acetaminophen (PERCOCET/ROXICET) 5-325 MG tablet Take 1-2 tablets by mouth every 6 (six) hours as needed for severe pain. 15 tablet 0   . progesterone (PROMETRIUM) 200 MG capsule TK 1 C VAGINALLY D       Past Medical History:  Diagnosis Date  . Anal fissure   . Hemorrhoids   . Irregular heart rhythm      No current facility-administered medications on file prior to encounter.    Current Outpatient Medications on File Prior to Encounter  Medication Sig Dispense Refill  . acetaminophen (TYLENOL) 500 MG tablet Take 500 mg by mouth every 6 (six) hours as needed.    Marland Kitchen aspirin 81 MG chewable tablet Chew by mouth daily.    Marland Kitchen docusate sodium (COLACE) 100 MG capsule Take 100 mg by mouth 2 (two) times daily.    . Pramoxine-HC (HC PRAMOXINE EX) Apply topically.    . Prenat-FeFum-FePo-FA-Omega 3 (CONCEPT DHA) 53.5-38-1 MG CAPS TK ONE C PO QD    . oxyCODONE-acetaminophen (PERCOCET/ROXICET) 5-325 MG tablet Take 1-2 tablets by mouth every 6 (six) hours as needed for severe pain. 15 tablet 0  . progesterone (PROMETRIUM) 200 MG capsule TK 1 C VAGINALLY D       Allergies  Allergen Reactions  . Latex Itching and Other (See Comments)    dryness    History of present pregnancy: Pt Info/Preference:  Screening/Consents:  Labs:   EDD: Estimated Date of Delivery: 02/18/19  Establised: No LMP recorded. Patient is pregnant.  Anatomy Scan: Date:  10/15/2018 Placenta Location: fundal Genetic Screen: Panoroma:low risk female  AFP:  First Tri: Quad:  Office: [facility]            First PNV: 15.1 wg Blood Type    Language: english Last PNV: 37.1 wg Rhogam    Flu Vaccine:  declined   Antibody    TDaP vaccine utd   GTT: Early: 5.3 Third Trimester: 107  Feeding Plan: breast BTL: no Rubella:    Contraception: ??? VBAC: no RPR:      Circumcision: ???   HBsAg:    Pediatrician:  ???   HIV:     Prenatal Classes: mo Additional US: Growth see below GBS:  (For PCN allergy, check sensitivities)       Chlamydia: neg    MFM Referral/Consult:  GC: neg  Support Person: Husband will be her tomorrow    PAP: ???  Pain Management: epidural Neonatologist Referral:  Hgb Electrophoresis:  AA  Birth Plan: none   Hgb NOB: 12.8    28W: 12.3  12/10/2018 growth  OB History    Gravida  4   Para      Term      Preterm      AB  3   Living        SAB  3   TAB      Ectopic      Multiple      Live Births             Past Medical History:  Diagnosis Date  . Anal fissure   . Hemorrhoids   . Irregular heart rhythm    Past Surgical History:  Procedure Laterality Date  . NO PAST SURGERIES     Family History: family history includes Cancer in her maternal grandmother; Kidney disease in her father. Social History:  reports that she has never smoked. She has never used smokeless tobacco. She reports current alcohol use of about 2.0 standard drinks of alcohol per week. She reports that she does not use drugs.   Prenatal Transfer Tool  Maternal Diabetes: No Genetic Screening: Normal Maternal Ultrasounds/Referrals: Normal Fetal Ultrasounds or other Referrals:  None Maternal Substance Abuse:  No Significant Maternal Medications:  NoneASA and Macrobid  Significant Maternal Lab Results: Group B Strep negative  ROS:  Review of Systems  Constitutional: Negative.   HENT: Negative.   Eyes: Negative.   Respiratory: Negative.   Cardiovascular: Negative.   Gastrointestinal: Negative.   Genitourinary: Negative.   Musculoskeletal: Negative.   Skin: Negative.   Neurological: Negative.   Endo/Heme/Allergies: Negative.   Psychiatric/Behavioral: Negative.      Physical Exam: BP 125/76   Pulse 91   Temp 98.2 F (36.8 C) (Oral)   Resp 17   Wt 94.6 kg   BMI 35.81 kg/m   Physical Exam  Constitutional: She is oriented  to person, place, and time and well-developed, well-nourished, and in no distress.  HENT:  Head: Normocephalic and atraumatic.  Eyes: Pupils are equal, round, and reactive to light. Conjunctivae are normal.  Neck: Normal range of motion. Neck supple.  Cardiovascular: Normal rate and regular rhythm.  Pulmonary/Chest: Effort normal and breath sounds normal.  Abdominal: Soft. Bowel sounds are normal.  Genitourinary:    Genitourinary Comments: Uterus gravid equal to dates, soft non-tender uterus, pelvis adequate for vaginal delivery    Musculoskeletal: Normal range of motion.  Neurological: She is alert and oriented to person, place, and time. She has normal reflexes. Gait normal.  No  clonus  Skin: Skin is warm and dry.  Psychiatric: Affect normal.  Nursing note and vitals reviewed.    NST: FHR baseline 150 bpm, Variability: moderate, Accelerations:present, Decelerations:  Absent= Cat 1/Reactive UC:   irregular, every 2 in 10 minutes, lasting 60-100 seconds SVE:  Dilation: 2 Effacement (%): 50 Station: [place], -3 Exam by:: [place], vertex verified by fetal sutures.  Leopold's: Position vertex, EFW 6.5 lbs via leopold's.   Labs: No results found for this or any previous visit (from the past 24 hour(s)).  Imaging:  No results found.  MAU Course: Orders Placed This Encounter  Procedures  . OB Urine Culture  . SARS CORONAVIRUS 2 (TAT 6-24 HRS) Nasopharyngeal Nasopharyngeal Swab  . Urinalysis, Routine w reflex microscopic  . Cervical Exam  . Notify Physician  . Vital signs  . Measure blood pressure   Meds ordered this encounter  Medications  . AND Linked Order Group   . labetalol (NORMODYNE) injection 20 mg   . labetalol (NORMODYNE) injection 40 mg   . labetalol (NORMODYNE) injection 80 mg   . hydrALAZINE (APRESOLINE) injection 10 mg  . DISCONTD: nitrofurantoin (macrocrystal-monohydrate) (MACROBID) capsule 100 mg    Assessment/Plan: Meghan Jackson is a 38  y.o. female, G4P0030, IUP at 37.2 weeks, presenting for IOL due to preeclampsia with severe features, pt has been asymptomatic and normotensive, but on 11/17 PCR was 0.6, was dx with UTI and treated with Macrobid x1 day completed, pt had labs done today, resulted PCR 0.334 and creatinine was 1.36, Plat 156, hgb 12.4, slightly elevated LFTs 20/38, denies HA, RUQ pain or vision changes. Pt endorse + Fm. Denies vaginal leakage. Denies vaginal bleeding. Denies feeling cxt's.   Pregnancy Problems advanced maternal age gravida (38yo; Panorama low risk) anal fissure (With abscess and sepsis 2014, I&D) cardiac arrhythmia (In past, negative cardiac w/u.) Goiter (Fine Needle Biopsy 2017 -benign, TSH was 0.7 on 10/14) recurrent miscarriage (X 2. Seen by fertility specialist_Dr. Kerin Perna 06/25/2018. Started vaginal progesterone / Baby ASA 5/18)  FWB: Cat 1 Fetal Tracing.   I discussed preeclampsia and IOL with patient risks, benefits and alternatives of labor induction including higher risk of cesarean delivery compared to spontaneous labor.  We discussed risks of induction agents including effects on fetal heart beat, contraction pattern and need for close monitoring.  Patient expressed understanding of all this and desired to proceed with the induction. Risks and benefits of induction were reviewed, including failure of method, prolonged labor, need for further intervention, risk of cesarean.  Patient and family verbalized understanding and denies any further questions at this time. Pt and family wish to proceed with induction process. Discussed induction options of Cytotec, foley bulb, AROM, and pitocin were reviewed as well as risks and benefits with use of each discussed.  Plan: Admit to [place] per consult with Dr Simona Huh Preeclampsia with SF: will hold on mag due to elevated creatinine and normotensive BP will monitor, if SR BP> 160/110 or neuro s/sx develop will start mag, neuro checks, strict  IO, labetalol protocol if indicated.  UTI Presumptive tx: Will monitor and drawn UC to send, pharmacy called and due to increased risk of hemolytic disease in NB is increased with macrobid use during labor and is contraindicated. If UC comes back +, will treat accordingly, or restart macrobid in PP.  Routine [facility] orders Pain med/epidural prn Start induction with cytotec.  Anticipate labor progression   Noralyn Pick NP-C, CNM, MSN 01/30/2019, 10:22 PM

## 2019-01-30 NOTE — MAU Note (Signed)
Covid swab obtained without difficulty and pt tol well. No symptoms 

## 2019-01-31 ENCOUNTER — Inpatient Hospital Stay (HOSPITAL_COMMUNITY): Payer: BC Managed Care – PPO | Admitting: Anesthesiology

## 2019-01-31 ENCOUNTER — Encounter (HOSPITAL_COMMUNITY): Payer: Self-pay | Admitting: *Deleted

## 2019-01-31 LAB — URINALYSIS, ROUTINE W REFLEX MICROSCOPIC
Bilirubin Urine: NEGATIVE
Glucose, UA: NEGATIVE mg/dL
Ketones, ur: NEGATIVE mg/dL
Nitrite: NEGATIVE
Protein, ur: 100 mg/dL — AB
Specific Gravity, Urine: 1.028 (ref 1.005–1.030)
pH: 6 (ref 5.0–8.0)

## 2019-01-31 LAB — COMPREHENSIVE METABOLIC PANEL
ALT: 38 U/L (ref 0–44)
AST: 26 U/L (ref 15–41)
Albumin: 2.9 g/dL — ABNORMAL LOW (ref 3.5–5.0)
Alkaline Phosphatase: 130 U/L — ABNORMAL HIGH (ref 38–126)
Anion gap: 11 (ref 5–15)
BUN: 11 mg/dL (ref 6–20)
CO2: 20 mmol/L — ABNORMAL LOW (ref 22–32)
Calcium: 9.8 mg/dL (ref 8.9–10.3)
Chloride: 105 mmol/L (ref 98–111)
Creatinine, Ser: 0.9 mg/dL (ref 0.44–1.00)
GFR calc Af Amer: >60 mL/min (ref >60)
GFR calc non Af Amer: >60 mL/min (ref >60)
Glucose, Bld: 94 mg/dL (ref 70–99)
Potassium: 3.8 mmol/L (ref 3.5–5.1)
Sodium: 136 mmol/L (ref 135–145)
Total Bilirubin: 0.4 mg/dL (ref 0.3–1.2)
Total Protein: 6.1 g/dL — ABNORMAL LOW (ref 6.5–8.1)

## 2019-01-31 LAB — CBC
HCT: 37.2 % (ref 36.0–46.0)
HCT: 40.8 % (ref 36.0–46.0)
Hemoglobin: 12.4 g/dL (ref 12.0–15.0)
Hemoglobin: 13.3 g/dL (ref 12.0–15.0)
MCH: 29.8 pg (ref 26.0–34.0)
MCH: 30.5 pg (ref 26.0–34.0)
MCHC: 32.6 g/dL (ref 30.0–36.0)
MCHC: 33.3 g/dL (ref 30.0–36.0)
MCV: 91.4 fL (ref 80.0–100.0)
MCV: 91.5 fL (ref 80.0–100.0)
Platelets: 163 10*3/uL (ref 150–400)
Platelets: 166 10*3/uL (ref 150–400)
RBC: 4.07 MIL/uL (ref 3.87–5.11)
RBC: 4.46 MIL/uL (ref 3.87–5.11)
RDW: 13.4 % (ref 11.5–15.5)
RDW: 13.4 % (ref 11.5–15.5)
WBC: 8 10*3/uL (ref 4.0–10.5)
WBC: 9.4 10*3/uL (ref 4.0–10.5)
nRBC: 0 % (ref 0.0–0.2)
nRBC: 0 % (ref 0.0–0.2)

## 2019-01-31 LAB — URIC ACID: Uric Acid, Serum: 5.8 mg/dL (ref 2.5–7.1)

## 2019-01-31 LAB — LACTATE DEHYDROGENASE: LDH: 135 U/L (ref 98–192)

## 2019-01-31 LAB — RPR: RPR Ser Ql: NONREACTIVE

## 2019-01-31 LAB — ABO/RH: ABO/RH(D): O POS

## 2019-01-31 LAB — SARS CORONAVIRUS 2 (TAT 6-24 HRS): SARS Coronavirus 2: NEGATIVE

## 2019-01-31 MED ORDER — LIDOCAINE HCL (PF) 1 % IJ SOLN
INTRAMUSCULAR | Status: DC | PRN
  Administered 2019-01-31 (×2): 5 mL via EPIDURAL

## 2019-01-31 MED ORDER — LACTATED RINGERS IV SOLN
500.0000 mL | Freq: Once | INTRAVENOUS | Status: AC
  Administered 2019-01-31: 1000 mL via INTRAVENOUS

## 2019-01-31 MED ORDER — PHENYLEPHRINE 40 MCG/ML (10ML) SYRINGE FOR IV PUSH (FOR BLOOD PRESSURE SUPPORT)
80.0000 ug | PREFILLED_SYRINGE | INTRAVENOUS | Status: DC | PRN
  Administered 2019-01-31 (×2): 80 ug via INTRAVENOUS

## 2019-01-31 MED ORDER — SODIUM CHLORIDE (PF) 0.9 % IJ SOLN
INTRAMUSCULAR | Status: DC | PRN
  Administered 2019-01-31: 12 mL/h via EPIDURAL

## 2019-01-31 MED ORDER — DIPHENHYDRAMINE HCL 50 MG/ML IJ SOLN: 12.5000 mg | INTRAMUSCULAR | Status: DC | PRN

## 2019-01-31 MED ORDER — OXYTOCIN 40 UNITS IN NORMAL SALINE INFUSION - SIMPLE MED
1.0000 m[IU]/min | INTRAVENOUS | Status: DC
  Administered 2019-01-31: 06:00:00 1 m[IU]/min via INTRAVENOUS

## 2019-01-31 MED ORDER — FENTANYL CITRATE (PF) 100 MCG/2ML IJ SOLN
100.0000 ug | INTRAMUSCULAR | Status: DC | PRN
  Administered 2019-01-31: 100 ug via INTRAVENOUS
  Filled 2019-01-31: qty 2

## 2019-01-31 MED ORDER — EPHEDRINE 5 MG/ML INJ
10.0000 mg | INTRAVENOUS | Status: DC | PRN
  Filled 2019-01-31: qty 10

## 2019-01-31 MED ORDER — FENTANYL-BUPIVACAINE-NACL 0.5-0.125-0.9 MG/250ML-% EP SOLN
12.0000 mL/h | EPIDURAL | Status: DC | PRN
  Filled 2019-01-31: qty 250

## 2019-01-31 MED ORDER — EPHEDRINE 5 MG/ML INJ: 10.0000 mg | INTRAVENOUS | Status: DC | PRN

## 2019-01-31 MED ORDER — PHENYLEPHRINE 40 MCG/ML (10ML) SYRINGE FOR IV PUSH (FOR BLOOD PRESSURE SUPPORT)
80.0000 ug | PREFILLED_SYRINGE | INTRAVENOUS | Status: DC | PRN
  Filled 2019-01-31: qty 10

## 2019-01-31 NOTE — Progress Notes (Signed)
Subjective:    Reports pressure and pushing w/ RN at bedside. Epidural effective for pain management. Pt has a hx of large, thrombosed hemorrhoids and pending surgery postpartum. Plan discussed to avoid extended pushing to minimize further trauma. Pt and spouse agree. Pt assisted to right lateral position w/ peanut ball.   Objective:    VS: BP 99/71   Pulse (!) 116   Temp 98.3 F (36.8 C) (Oral)   Resp 16   Ht 5\' 4"  (1.626 m)   Wt 94.6 kg   SpO2 99%   BMI 35.81 kg/m  FHR : baseline 140 / variability moderate / accelerations present / early decelerations Toco: contractions every 4 minutes / moderate per palpation Membranes: AROM since 1255 on 01/31/19, clear fluid continues Dilation: 10 Dilation Complete Date: 01/31/19 Dilation Complete Time: 2100 Effacement (%): 100 Station: Plus 1 Presentation: Vertex Exam by: Clois Dupes, CNM Pitocin 6 mU/min  Assessment/Plan:   38 y.o. G4P0030 [redacted]w[redacted]d  Labor: Ineffective pushing w/ minimal decent of vertex noted. Pt to labor down to avoid further exacerbation of thrombosing hemorrhoids.    Preeclampsia:  no signs or symptoms of toxicity and intake and ouput balanced Fetal Wellbeing:  Category I Pain Control:  Epidural I/D:  GBS negative Anticipated MOD:  NSVD  Arrie Eastern CNM, MSN 01/31/2019 10:10 PM

## 2019-01-31 NOTE — Anesthesia Preprocedure Evaluation (Signed)
Anesthesia Evaluation  Patient identified by MRN, date of birth, ID band Patient awake    Reviewed: Allergy & Precautions, NPO status , Patient's Chart, lab work & pertinent test results  History of Anesthesia Complications Negative for: history of anesthetic complications  Airway Mallampati: III  TM Distance: >3 FB Neck ROM: Full    Dental no notable dental hx. (+) Dental Advisory Given   Pulmonary neg pulmonary ROS,    Pulmonary exam normal        Cardiovascular hypertension, Normal cardiovascular exam     Neuro/Psych negative neurological ROS  negative psych ROS   GI/Hepatic negative GI ROS, Neg liver ROS,   Endo/Other  negative endocrine ROS  Renal/GU negative Renal ROS  negative genitourinary   Musculoskeletal negative musculoskeletal ROS (+)   Abdominal   Peds negative pediatric ROS (+)  Hematology negative hematology ROS (+)   Anesthesia Other Findings   Reproductive/Obstetrics (+) Pregnancy preeclampsia                             Anesthesia Physical Anesthesia Plan  ASA: III  Anesthesia Plan: Epidural   Post-op Pain Management:    Induction:   PONV Risk Score and Plan:   Airway Management Planned: Natural Airway  Additional Equipment:   Intra-op Plan:   Post-operative Plan:   Informed Consent: I have reviewed the patients History and Physical, chart, labs and discussed the procedure including the risks, benefits and alternatives for the proposed anesthesia with the patient or authorized representative who has indicated his/her understanding and acceptance.     Dental advisory given  Plan Discussed with: Anesthesiologist  Anesthesia Plan Comments:         Anesthesia Quick Evaluation

## 2019-01-31 NOTE — Anesthesia Procedure Notes (Signed)
Epidural Patient location during procedure: OB Start time: 01/31/2019 2:56 PM End time: 01/31/2019 3:14 PM  Staffing Anesthesiologist: Duane Boston, MD Performed: anesthesiologist   Preanesthetic Checklist Completed: patient identified, site marked, pre-op evaluation, timeout performed, IV checked, risks and benefits discussed and monitors and equipment checked  Epidural Patient position: sitting Prep: DuraPrep Patient monitoring: heart rate, cardiac monitor, continuous pulse ox and blood pressure Approach: midline Location: L2-L3 Injection technique: LOR saline  Needle:  Needle type: Tuohy  Needle gauge: 17 G Needle length: 9 cm Needle insertion depth: 7 cm Catheter size: 20 Guage Catheter at skin depth: 12 cm Test dose: negative and Other  Assessment Events: blood not aspirated, injection not painful, no injection resistance and negative IV test  Additional Notes Informed consent obtained prior to proceeding including risk of failure, 1% risk of PDPH, risk of minor discomfort and bruising.  Discussed rare but serious complications including epidural abscess, permanent nerve injury, epidural hematoma.  Discussed alternatives to epidural analgesia and patient desires to proceed.  Timeout performed pre-procedure verifying patient name, procedure, and platelet count.  Patient tolerated procedure well.

## 2019-01-31 NOTE — Progress Notes (Signed)
Pt denies any headache, SOB or CP BP 116/71   Pulse 95   Temp 98.4 F (36.9 C) (Oral)   Resp 18   Ht 5\' 4"  (1.626 m)   Wt 94.6 kg   SpO2 99%   BMI 35.81 kg/m  Cat 1 cx 3-4 / 80/-1 AROM clear Preeclampsia Pt stable  IV pain meds or epidural prn Pitocin induction BP s are stable Anticipate SVD

## 2019-01-31 NOTE — Progress Notes (Addendum)
Labor Progress Note  Meghan Jackson is a 38 y.o. female, G4P0030, IUP at 37.2 weeks, presenting for IOL due to preeclampsia with severe features, pt has been asymptomatic and normotensive, but on 11/17 PCR was 0.6, was dx with UTI and treated with Macrobid, denies dysuria, pt had labs done today, resulted PCR 0.334 and creatinine was 1.36, Plat 156, hgb 12.4, slightly elevated LFTs 20/38, denies HA, RUQ pain or vision changes. Pt endorse + Fm. Denies vaginal leakage. Denies vaginal bleeding. Denies feeling cxt's.   Pregnancy Problems advanced maternal age gravida (38yo; Panorama low risk) anal fissure (With abscess and sepsis 2014, I&D) cardiac arrhythmia (In past, negative cardiac w/u.) Goiter (Fine Needle Biopsy 2017 -benign, TSH was 0.7 on 10/14) recurrent miscarriage (X 2. Seen by fertility specialist_Dr. Kerin Perna 06/25/2018. Started vaginal progesterone / Baby ASA 5/18)  Subjective: Pt restingin bed in stable condition , denies complaints, feeling cxt, very light but starting to get regular, able to sleep through them. Denies HA, RUQ pain or vision changes.  Patient Active Problem List   Diagnosis Date Noted  . Preeclampsia 01/30/2019  . Traumatic injury during pregnancy in second trimester 11/25/2018  . 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 Multinodular goiter (nontoxic) 04/05/2016  . Dizziness 10/22/2013  . Unspecified episodic mood disorder 10/22/2013  . Abdominal pain, left upper quadrant 07/07/2013  . Preventative health care 07/07/2013   Objective: BP (!) 141/88   Pulse 79   Temp 98.6 F (37 C) (Oral)   Resp 18   Ht 5\' 4"  (1.626 m)   Wt 94.6 kg   BMI 35.81 kg/m  No intake/output data recorded. No intake/output data recorded. NST: FHR baseline 140 bpm, Variability: moderate, Accelerations:present, Decelerations:  Absent= Cat 1/Reactive CTX:  irregular, every 2-4 minutes, lasting 50/100 seconds Uterus gravid, soft non tender, mild to palpate with  contractions.  SVE:  Dilation: 3 Effacement (%): 90 Station: -2 Exam by:: [employer] CNM Pitocin at 0 mUn/min  Assessment:  Meghan Jackson is a 38 y.o. female, G4P0030, IUP at 37.2 weeks, presenting for IOL due to preeclampsia with severe features, bp now 144/88, pt has been asymptomatic and normotensive, but on 11/17 PCR was 0.6, was dx with UTI and treated with Macrobid, denies dysuria, pt had labs done today, resulted PCR 0.334 and creatinine was 1.36, Plat 156, hgb 12.4, slightly elevated LFTs 20/38, denies HA, RUQ pain or vision changes. Progressing with one cytotex   Pregnancy Problems advanced maternal age gravida (38yo; Panorama low risk) anal fissure (With abscess and sepsis 2014, I&D) cardiac arrhythmia (In past, negative cardiac w/u.) Goiter (Fine Needle Biopsy 2017 -benign, TSH was 0.7 on 10/14) recurrent miscarriage (X 2. Seen by fertility specialist_Dr. Kerin Perna 06/25/2018. Started vaginal progesterone / Baby ASA 5/18)  Patient Active Problem List   Diagnosis Date Noted  . Preeclampsia 01/30/2019  . Traumatic injury during pregnancy in second trimester 11/25/2018  . 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 Multinodular goiter (nontoxic) 04/05/2016  . Dizziness 10/22/2013  . Unspecified episodic mood disorder 10/22/2013  . Abdominal pain, left upper quadrant 07/07/2013  . Preventative health care 07/07/2013   NICHD: Category 1  Membranes: Intact, no s/s of infection  Induction:    Cytotec x 1 dose at 2349  Pitocin - Plan to start  Pain management:               IV pain management: x0             Epidural placement: PRN  GBS Negative   Plan: Continue labor plan Continuous monitoring PeE: Continue to monitor BP, labetalol protocol if indicated plan to start mag if severe range BP >160/110 or neruo s/sx present Rest Ambulate Frequent position changes to facilitate fetal rotation and descent. Dr Normand Sloop will reassess with cervical exam when/ if  necessary Start pitocin per protocol 1x1 Anticipate AROM once regular cxt pattern established  Anticipate labor progression and vaginal delivery.   Md Dillard aware of plan and verbalized agreement.   [place], NP-C, CNM, MSN 01/31/2019. 5:11 AM

## 2019-02-01 ENCOUNTER — Encounter (HOSPITAL_COMMUNITY): Payer: Self-pay

## 2019-02-01 DIAGNOSIS — K644 Residual hemorrhoidal skin tags: Secondary | ICD-10-CM | POA: Diagnosis present

## 2019-02-01 LAB — CBC
HCT: 34.9 % — ABNORMAL LOW (ref 36.0–46.0)
Hemoglobin: 11.3 g/dL — ABNORMAL LOW (ref 12.0–15.0)
MCH: 30.1 pg (ref 26.0–34.0)
MCHC: 32.4 g/dL (ref 30.0–36.0)
MCV: 93.1 fL (ref 80.0–100.0)
Platelets: 155 10*3/uL (ref 150–400)
RBC: 3.75 MIL/uL — ABNORMAL LOW (ref 3.87–5.11)
RDW: 13.8 % (ref 11.5–15.5)
WBC: 13 10*3/uL — ABNORMAL HIGH (ref 4.0–10.5)
nRBC: 0 % (ref 0.0–0.2)

## 2019-02-01 LAB — CULTURE, OB URINE

## 2019-02-01 MED ORDER — DIPHENHYDRAMINE HCL 25 MG PO CAPS: 25.0000 mg | ORAL_CAPSULE | Freq: Four times a day (QID) | ORAL | Status: DC | PRN

## 2019-02-01 MED ORDER — TETANUS-DIPHTH-ACELL PERTUSSIS 5-2.5-18.5 LF-MCG/0.5 IM SUSP: 0.5000 mL | Freq: Once | INTRAMUSCULAR | Status: DC

## 2019-02-01 MED ORDER — ONDANSETRON HCL 4 MG PO TABS: 4.0000 mg | ORAL_TABLET | ORAL | Status: DC | PRN

## 2019-02-01 MED ORDER — ACETAMINOPHEN 325 MG PO TABS: 650.0000 mg | ORAL_TABLET | ORAL | Status: DC | PRN

## 2019-02-01 MED ORDER — OXYCODONE-ACETAMINOPHEN 5-325 MG PO TABS: 1.0000 | ORAL_TABLET | ORAL | Status: DC | PRN

## 2019-02-01 MED ORDER — DIBUCAINE (PERIANAL) 1 % EX OINT: 1.0000 "application " | TOPICAL_OINTMENT | CUTANEOUS | Status: DC | PRN

## 2019-02-01 MED ORDER — SENNOSIDES-DOCUSATE SODIUM 8.6-50 MG PO TABS
2.0000 | ORAL_TABLET | ORAL | Status: DC
  Administered 2019-02-01: 2 via ORAL
  Filled 2019-02-01: qty 2

## 2019-02-01 MED ORDER — COCONUT OIL OIL: 1.0000 "application " | TOPICAL_OIL | Status: DC | PRN

## 2019-02-01 MED ORDER — IBUPROFEN 600 MG PO TABS
600.0000 mg | ORAL_TABLET | Freq: Four times a day (QID) | ORAL | Status: DC
  Administered 2019-02-01 – 2019-02-02 (×6): 600 mg via ORAL
  Filled 2019-02-01 (×6): qty 1

## 2019-02-01 MED ORDER — BENZOCAINE-MENTHOL 20-0.5 % EX AERO: 1.0000 "application " | INHALATION_SPRAY | CUTANEOUS | Status: DC | PRN

## 2019-02-01 MED ORDER — HYDROCORT-PRAMOXINE (PERIANAL) 1-1 % EX FOAM
Freq: Two times a day (BID) | CUTANEOUS | Status: DC
  Administered 2019-02-01: 1 via RECTAL
  Filled 2019-02-01: qty 10

## 2019-02-01 MED ORDER — ZOLPIDEM TARTRATE 5 MG PO TABS: 5.0000 mg | ORAL_TABLET | Freq: Every evening | ORAL | Status: DC | PRN

## 2019-02-01 MED ORDER — PRENATAL MULTIVITAMIN CH
1.0000 | ORAL_TABLET | Freq: Every day | ORAL | Status: DC
  Administered 2019-02-01 – 2019-02-02 (×2): 1 via ORAL
  Filled 2019-02-01 (×2): qty 1

## 2019-02-01 MED ORDER — MAGNESIUM HYDROXIDE 400 MG/5ML PO SUSP: 30.0000 mL | ORAL | Status: DC | PRN

## 2019-02-01 MED ORDER — SIMETHICONE 80 MG PO CHEW: 80.0000 mg | CHEWABLE_TABLET | ORAL | Status: DC | PRN

## 2019-02-01 MED ORDER — HYDROCORT-PRAMOXINE (PERIANAL) 2.5-1 % EX CREA
TOPICAL_CREAM | Freq: Two times a day (BID) | CUTANEOUS | Status: DC
  Filled 2019-02-01: qty 30

## 2019-02-01 MED ORDER — WITCH HAZEL-GLYCERIN EX PADS: 1.0000 "application " | MEDICATED_PAD | CUTANEOUS | Status: DC | PRN

## 2019-02-01 MED ORDER — ONDANSETRON HCL 4 MG/2ML IJ SOLN: 4.0000 mg | INTRAMUSCULAR | Status: DC | PRN

## 2019-02-01 NOTE — Progress Notes (Signed)
Delivery Note Labor onset: 01/31/2019  Labor Onset Time: 0548 Complete dilation at 8:39 PM  Onset of pushing at 2100 x1 hr w/ a break then resumed pushing @ 2320 FHR second stage Cat 1 and Cat 2 Analgesia/Anesthesia intrapartum: Epidural  Guided, voluntary pushing with maternal urge. Birth of a viable female at 62. Fetal head delivered in ROA position and restituted to ROT. Nuchal cord x1, head delivered w/ somersault. Infant placed on maternal abd, dried, and tactile stim. Lusty cry at 1 min after birth.   Cord double clamped and cut by father, Leory Plowman.  Cord blood sample collected Arterial cord blood sample N/A.  Placenta delivered Delena Bali side out, intact, with 3 VC.  Placenta to L&D. Uterine tone firm bleeding scant  Vaginal vault laceration identified.  Anesthesia: Epidural Repair:  4-0 Vicryl SH QBL/EBL (mL): 294 Complications: none APGAR: APGAR (1 MIN): 6   APGAR (5 MINS): 9   APGAR (10 MINS):   Mom to postpartum.  Baby to Couplet care / Skin to Skin.  Meghan Eastern MSN, CNM 02/01/2019, 12:44 AM

## 2019-02-01 NOTE — Lactation Note (Signed)
This note was copied from a baby's chart. Lactation Consultation Note Baby 5 hrs old.  Assisted in BF in football hold. Mom has cone shaped breast w/everted nipples.  Hand expression w/o colostrum noted. Baby latched suckle once or twice then stops. Baby off and on. Mom keeps putting her hands close to nipple. Explained multiple times not to touch areola so baby can get nipple and areola in his mouth and obtain deep latch. Mom keeps forgetting. Mom not focusing on baby but on TV. Encouraged mom to talk to the baby and she says a few words to him. Baby not very interested in BF at this time. Encouraged mom to cont. To hold baby STS. Mom agreed.  Newborn behavior, STS, I&O, milk storage, breast massage, cluster feeding, supply and demand discussed.  Encouraged mom to feed baby w/cues and every 3 hrs if hasn't cued attempt. Mom encouraged to feed baby 8-12 times/24 hours and with feeding cues.  Lactation brochure given.  Patient Name: Meghan Jackson [id number] Date: 02/01/2019 Reason for consult: Initial assessment;Primapara;Early term 37-38.6wks   Maternal Data Has patient been taught Hand Expression?: Yes Does the patient have breastfeeding experience prior to this delivery?: No  Feeding Feeding Type: Breast Fed  LATCH Score Latch: Too sleepy or reluctant, no latch achieved, no sucking elicited.  Audible Swallowing: None  Type of Nipple: Everted at rest and after stimulation  Comfort (Breast/Nipple): Soft / non-tender  Hold (Positioning): Full assist, staff holds infant at breast  LATCH Score: 4  Interventions Interventions: Breast feeding basics reviewed;Adjust position;Assisted with latch;Support pillows;Skin to skin;Position options;Breast massage;Hand express;Breast compression  Lactation Tools Discussed/Used WIC Program: No   Consult Status Consult Status: Follow-up Date: 02/01/19(in pm) Follow-up type: In-patient    Theodoro Kalata 02/01/2019, 5:20  AM

## 2019-02-01 NOTE — Progress Notes (Signed)
MOB was referred for history of depression/anxiety. * Referral screened out by Clinical Social Worker because none of the following criteria appear to apply: ~ History of anxiety/depression during this pregnancy, or of post-partum depression following prior delivery. ~ Diagnosis of anxiety and/or depression within last 3 years. Per further chart review. MOB diagnosed with mood disorder/depression in 2015. OR  * MOB's symptoms currently being treated with medication and/or therapy. Please contact the Clinical Social Worker if needs arise, by MOB request, or if MOB scores greater than 9/yes to question 10 on Edinburgh Postpartum Depression Screen.     Dhanvin Szeto S. Deo Mehringer, MSW, LCSW [facility] [phone]    

## 2019-02-02 MED ORDER — HYDROCORT-PRAMOXINE (PERIANAL) 1-1 % EX FOAM: Freq: Two times a day (BID) | CUTANEOUS | 0 refills | Status: DC

## 2019-02-02 NOTE — Discharge Summary (Signed)
OB Discharge Summary     Patient Name: Meghan Jackson DOB: [birth year] MRN: [id number]  Date of admission: 01/30/2019 Delivering MD: Burman Foster B   Date of discharge: 02/02/2019  Admitting diagnosis: Evaluation Intrauterine pregnancy: [redacted]w[redacted]d     Secondary diagnosis:  Principal Problem:   Postpartum care following vaginal delivery 11/21 Active Problems:   Preeclampsia   SVD (11/21)   Hemorrhoids, external, with complication   Obstetrical laceration - vaginal  Additional problems: None     Discharge diagnosis: Term Pregnancy Delivered             Pre eclampsia with mild features AMA                                                                                    Post partum procedures:None  Augmentation: AROM and Pitocin  Complications: None  Hospital course:  Induction of Labor With Vaginal Delivery   38 y.o. yo [phone] at [redacted]w[redacted]d was admitted to the hospital 01/30/2019 for induction of labor.  Indication for induction: Preeclampsia.  Patient had an uncomplicated labor course as follows: Membrane Rupture Time/Date: 12:55 PM ,01/31/2019   Intrapartum Procedures: Episiotomy: None [1]                                         Lacerations:  Vaginal [6]  Patient had delivery of a Viable infant.  Information for the patient's newborn:  Tiwanna, Tuch [[id number]]      02/01/2019  Details of delivery can be found in separate delivery note.  Patient had a routine postpartum course. Patient is discharged home 02/02/19.  Physical exam  Vitals:   02/01/19 0745 02/01/19 1500 02/01/19 2324 02/02/19 0526  BP: 128/87 122/89 103/67 119/76  Pulse: 86 80 90 86  Resp: 18 18 18 20   Temp: 98.6 F (37 C) 98.5 F (36.9 C) 98.3 F (36.8 C) 97.7 F (36.5 C)  TempSrc: Oral Oral  Oral  SpO2: 98% 99%  100%  Weight:      Height:       General: alert, cooperative and no distress Lochia: appropriate Uterine Fundus: firm Incision: N/A Rectum: enlarged hemorroids. DVT  Evaluation: No evidence of DVT seen on physical exam. Labs: Lab Results  Component Value Date   WBC 13.0 (H) 02/01/2019   HGB 11.3 (L) 02/01/2019   HCT 34.9 (L) 02/01/2019   MCV 93.1 02/01/2019   PLT 155 02/01/2019   CMP Latest Ref Rng & Units 01/30/2019  Glucose 70 - 99 mg/dL 94  BUN 6 - 20 mg/dL 11  Creatinine 0.44 - 1.00 mg/dL 0.90  Sodium 135 - 145 mmol/L 136  Potassium 3.5 - 5.1 mmol/L 3.8  Chloride 98 - 111 mmol/L 105  CO2 22 - 32 mmol/L 20(L)  Calcium 8.9 - 10.3 mg/dL 9.8  Total Protein 6.5 - 8.1 g/dL 6.1(L)  Total Bilirubin 0.3 - 1.2 mg/dL 0.4  Alkaline Phos 38 - 126 U/L 130(H)  AST 15 - 41 U/L 26  ALT 0 - 44 U/L 38    Discharge instruction: per After  Visit Summary and "Baby and Me Booklet".  After visit meds:  See MAR  Diet: routine diet  Activity: Advance as tolerated. Pelvic rest for 6 weeks.   Outpatient follow up:6 weeks Follow up Appt: Future Appointments  Date Time Provider Department Center  03/24/2019  9:30 AM Carlus Pavlov, MD [id number] None  Follow up with outpatient provider regarding Hemorrhoid surgery Follow up Visit:No follow-ups on file.  Postpartum contraception: Undecided  Newborn Data: Live born female  Birth Weight: 6 lb 3.5 oz (2821 g) APGAR: 6, 9  Newborn Delivery   Time head delivered: 02/01/2019 00:02:00 Birth date/time: 02/01/2019 00:04:00 Delivery type: Vaginal, Spontaneous      Baby Feeding: Breast Disposition:home with mother   02/02/2019 Kenney Houseman, CNM

## 2019-02-02 NOTE — Lactation Note (Signed)
This note was copied from a baby's chart. Lactation Consultation Note Baby 69 hrs old at time of consult. RN reported baby had been crying a lot having difficulty getting abby to feed, baby acting hungry. LC placed baby on the breast, he suckled maybe total of 2 minutes at intervals mainly holding nipple in mouth. LC massaged breast. Encouraged mom to massage breast to stimulate baby to suckle. Mom has very flat affect, not aggressive in feeding. Mom wants to BF and hold him STS. LC encouraged mom to hold breast in her hand and guide the nipple into baby's mouth that the baby doesn't know how to get on.  Baby was jittery when [facility] positioning baby for latching. Asked mom if the baby had been that jittery. Mom stated that he wasn't jittery, he was cold because LC unwrapped him and moved him. Discussed baby's glucose levels and sign and symptoms of low blood sugar. Discussed behavior of 37 week baby. Baby is DAT +. Since baby wasn't feeding well, discussed supplementing w/Donor milk. Mom agreed. LC discussed Donor milk mom agreed. Mom sign consent, name verified. Milk storage of donor milk, warming and prep discussed. Mom verbalized information back to [facility]. Instructed and demonstrated syring feeding w/gloved finger. LC used gloved finger and held baby upright, mom syring fed baby. Mom has DEBP set up at bedside. Encouraged to pump for stimulation. Reported to RN of status.  Patient Name: Meghan Jackson [id number] Date: 02/02/2019 Reason for consult: Follow-up assessment;Mother's request;Difficult latch;Early term 37-38.6wks;Infant < 6lbs   Maternal Data Has patient been taught Hand Expression?: Yes Does the patient have breastfeeding experience prior to this delivery?: No  Feeding Feeding Type: Donor Breast Milk  LATCH Score Latch: Too sleepy or reluctant, no latch achieved, no sucking elicited.  Audible Swallowing: None  Type of Nipple: Everted at rest and after stimulation  Comfort  (Breast/Nipple): Soft / non-tender  Hold (Positioning): Full assist, staff holds infant at breast  LATCH Score: 4  Interventions Interventions: Breast feeding basics reviewed;Adjust position;Assisted with latch;Support pillows;Skin to skin;Position options;Breast massage;Hand express;Breast compression  Lactation Tools Discussed/Used WIC Program: No   Consult Status Consult Status: Follow-up Date: 02/02/19 Follow-up type: In-patient    Theodoro Kalata 02/02/2019, 5:09 AM

## 2019-02-02 NOTE — Anesthesia Postprocedure Evaluation (Signed)
Anesthesia Post Note  Patient: Meghan Jackson  Procedure(s) Performed: AN AD [place]     Patient location during evaluation: Mother Baby Anesthesia Type: Epidural Level of consciousness: awake and alert and oriented Pain management: satisfactory to patient Vital Signs Assessment: post-procedure vital signs reviewed and stable Respiratory status: spontaneous breathing and nonlabored ventilation Cardiovascular status: stable Postop Assessment: no headache, no backache, no signs of nausea or vomiting, adequate PO intake, patient able to bend at knees and able to ambulate (patient up walking) Anesthetic complications: no    Last Vitals:  Vitals:   02/01/19 2324 02/02/19 0526  BP: 103/67 119/76  Pulse: 90 86  Resp: 18 20  Temp: 36.8 C 36.5 C  SpO2:  100%    Last Pain:  Vitals:   02/02/19 0527  TempSrc:   PainSc: 0-No pain   Pain Goal: Patients Stated Pain Goal: 6 (01/31/19 0730)                 [name]

## 2019-02-02 NOTE — Lactation Note (Signed)
This note was copied from a baby's chart. Lactation Consultation Note  Patient Name: Boy Ica Daye [id number] Date: 02/02/2019 Reason for consult: Follow-up assessment   Baby 47 hours old and out of room to be circumcised.  Baby [redacted]w[redacted]d. Discussed cluster feeding and feeding frequency. Encouraged mother to breastfeed before offering formula. Discussed supply and demand.   Recommend mother pump each time she give formula. Give volume pumped back to baby. Feed on demand with cues.  Goal 8-12+ times per day after first 24 hrs.  Place baby STS if not cueing.  Reviewed engorgement care and referred mother to booklet for milk storage.    Maternal Data    Feeding    LATCH Score                   Interventions Interventions: Breast feeding basics reviewed;DEBP;Hand pump  Lactation Tools Discussed/Used     Consult Status Consult Status: Complete Date: 02/02/19    Vivianne Master [facility] 02/02/2019, 2:14 PM

## 2019-03-24 ENCOUNTER — Ambulatory Visit: Payer: BC Managed Care – PPO | Admitting: Internal Medicine

## 2019-03-24 ENCOUNTER — Encounter: Payer: Self-pay | Admitting: Internal Medicine

## 2019-03-24 ENCOUNTER — Ambulatory Visit (INDEPENDENT_AMBULATORY_CARE_PROVIDER_SITE_OTHER): Payer: BC Managed Care – PPO | Admitting: Internal Medicine

## 2019-03-24 DIAGNOSIS — E042 Nontoxic multinodular goiter: Secondary | ICD-10-CM

## 2019-03-24 DIAGNOSIS — E0590 Thyrotoxicosis, unspecified without thyrotoxic crisis or storm: Secondary | ICD-10-CM

## 2019-03-24 NOTE — Progress Notes (Signed)
Patient ID: Meghan Jackson, female   DOB: 07-19-1980, 39 y.o.   MRN: [id number]   Patient location: Home My location: Office Persons participating in the virtual visit: patient, provider  Referring Provider: Deatra James, MD  I connected with the patient on 03/24/19 at  9:25 AM EST by a video enabled telemedicine application and verified that I am speaking with the correct person.   I discussed the limitations of evaluation and management by telemedicine and the availability of in person appointments. The patient expressed understanding and agreed to proceed.   Details of the encounter are shown below.  HPI  Meghan Jackson is a 39 y.o.-year-old female, presenting for follow-up for thyroid nodules and also for history of elevated T3 and low free T4.  Last visit 2.5 months ago.  At that time, she was pregnant at [redacted] weeks.  She was induced for preeclampsia 01/30/2019 (EDD was 02/18/2019).  She had a healthy boy.  Patient has a history of 2 miscarriages and also having infertility treatments with Dr. Juliene Pina.  During investigation for the above problems, TSH was found to be normal but her T3 was elevated and she was referred back to endocrinology at that time.  In the months prior to our last appointment she had a normal TSH and a slightly low free T4.  The free T4 remains slightly low in 12/2018.  She also has a history of thyroid nodules that were discovered incidentally during investigation for infertility. At that time, on palpation, the thyroid was felt to be enlarged.  The thyroid ultrasound showed 2 dominant nodules, which were biopsied with benign results.  She had another thyroid ultrasound in 2019 that showed that the 2 nodules could have been areas of inflammation (pseudonodules)  Reviewed her imaging test reports and Thyroid U/S (01/05/2016):  Isthmic 1.1 cm isoechoic nodule  Right mid 2.8 x 1.3 x 2.1 cm solid isoechoic nodule; few other small scattered cystic areas including a 0.6 cm  predominately cystic nodule in the superior right lobe and a 0.5 cm partially cystic nodule in the inferior right lobe which are clearly low suspicion.  Left superior 3.1 x 1.9 x 3 cm solid, isoechoic, nodule  FNA of both dominant nodules (01/21/2016): Benign  Thyroid U/S (04/19/2017): Parenchymal Echotexture: Markedly heterogenous Isthmus: Enlarged measuring 0.6 cm in diameter, unchanged Right lobe: Enlarged measuring 7.2 x 3.2 x 3.8 cm, unchanged, previously, 6.9 x 3.3 x 3.9 cm Left lobe: Enlarged measuring 8.0 x 2.9 x 3.6 cm, unchanged, previously, 6.8 x 3.0 x 3.7 cm __________________________________________  Nodule # 1: Prior biopsy: No Location: Isthmus; Mid Maximum size: 1.1 cm; Other 2 dimensions: 1.1 x 0.9 cm, previously, 1.1 x 1.1 x 0.9 cm Composition: solid/almost completely solid (2) Echogenicity: isoechoic (1)  Given size (<1.4 cm) and appearance, this nodule does NOT meet TI-RADS criteria for biopsy or dedicated follow-up. _________________________________________________________  There is a punctate (approximately 0.6 cm) hypoechoic nodule within the superior pole the right lobe of the thyroid which does not meet imaging criteria to recommend percutaneous sampling or dedicated follow-up  Previously biopsied ill-defined approximately 2.8 cm apparent nodule within the mid aspect of the right lobe of the thyroid is less conspicuous on the present examination and thus is favored to have represented a pseudo nodule. Correlation with prior biopsy results is recommended.  Previously biopsied 3.1 cm nodule/mass within the superior pole of the left lobe of the thyroid is less conspicuous on the present examination and thus favored to have represented  a pseudo nodule. Correlation prior biopsy results is recommended.  IMPRESSION: 1. Similar findings of multinodular goiter. No definitive new or enlarging thyroid nodules. 2. Previously biopsied bilateral thyroid nodules are  less conspicuous on the present examination and may have represented pseudo nodules. Correlation with prior biopsy results is recommended. Assuming benign pathologic diagnosis, repeat sampling and/or continued dedicated follow-up is not recommended.  Pt denies: - feeling nodules in neck - hoarseness - dysphagia - choking - SOB with lying down  Reviewed her TFTs: 12/26/2018: TSH 1.24, Free T4 0.70 (0.82-1.77). Lab Results  Component Value Date   TSH 0.67 09/19/2018   TSH 1.00 04/05/2017   TSH 0.401 07/07/2013   FREET4 0.55 (L) 09/19/2018   FREET4 0.81 04/05/2017   Lab Results  Component Value Date   T3FREE 3.6 09/19/2018   T3FREE 4.1 04/05/2017  08/29/2018: TSH 0.57 12/25/2015: TSH 0.794, total T4 6.7  Her thyroid antibodies were not elevated: Component     Latest Ref Rng & Units 09/19/2018  Thyroperoxidase Ab SerPl-aCnc     <9 IU/mL 1  Thyroglobulin Ab     < or = 1 IU/mL <1  TSI     <140 % baseline <89  12/25/2015: TPO antibodies 15 (0-34)  No FH of thyroid disease or thyroid cancer. No h/o radiation tx to head or neck.  No seaweed or kelp. No recent contrast studies. No herbal supplements. No Biotin use. No recent steroids use.   Pt also has a history of sepsis from a boil.  ROS: Constitutional: no weight gain/+ weight loss, no fatigue, no subjective hyperthermia, no subjective hypothermia Eyes: no blurry vision, no xerophthalmia ENT: no sore throat, + see HPI Cardiovascular: no CP/no SOB/no palpitations/no leg swelling Respiratory: no cough/no SOB/no wheezing Gastrointestinal: no N/no V/no D/no C/no acid reflux Musculoskeletal: no muscle aches/no joint aches Skin: no rashes, no hair loss Neurological: no tremors/no numbness/no tingling/no dizziness  I reviewed pt's medications, allergies, PMH, social hx, family hx, and changes were documented in the history of present illness. Otherwise, unchanged from my initial visit note.  Past Medical History:   Diagnosis Date  . Anal fissure   . Hemorrhoids   . Irregular heart rhythm    Past Surgical History:  Procedure Laterality Date  . NO PAST SURGERIES     Social History   Social History  . Marital status: Married     Spouse name: N/A  . Number of children: 0   Occupational History  . Internal trainer   Social History Main Topics  . Smoking status: Never Smoker  . Smokeless tobacco: Never Used  . Alcohol use     1 Glasses of wine Every 2 months      Comment: occ  . Drug use: No  . Sexual activity: Yes   Current Outpatient Medications on File Prior to Visit  Medication Sig Dispense Refill  . acetaminophen (TYLENOL) 500 MG tablet Take 500 mg by mouth every 6 (six) hours as needed.    . hydrocortisone-pramoxine (PROCTOFOAM-HC) rectal foam Place rectally 2 (two) times daily. 10 g 0  . Pramoxine-HC (HC PRAMOXINE EX) Apply topically.    . Prenat-FeFum-FePo-FA-Omega 3 (CONCEPT DHA) 53.5-38-1 MG CAPS TK ONE C PO QD     No current facility-administered medications on file prior to visit.   Allergies  Allergen Reactions  . Latex Itching and Other (See Comments)    dryness   Family History  Problem Relation Age of Onset  . Kidney disease Father   .  Cancer Maternal Grandmother    PE: There were no vitals taken for this visit. Wt Readings from Last 3 Encounters:  01/30/19 208 lb 9.6 oz (94.6 kg)  01/02/19 205 lb (93 kg)  11/25/18 195 lb 9.6 oz (88.7 kg)   Constitutional:  in NAD  The physical exam was not performed (virtual visit).  ASSESSMENT: 1. Multiple thyroid nodules  2. Abnormal free thyroid hormones   PLAN: 1. Multiple thyroid nodules -Reviewed the report of the latest thyroid ultrasound which showed that the thyroid was slightly larger.  The dominant nodules were not larger,  they were isoechoic, without microcalcifications, and, they appeared more as areas of inflammation (or nodules), therefore benign.  Moreover, she had biopsies of these 2 nodules and  these were benign.  We discussed that no follow-up is necessary for these.  There was another small nodule on the ultrasound from 2019 which was stable in size but did not have a pseudonodular appearance.  Since this was so small and isoechoic, no further investigation is needed for this, also -She does not have neck compression symptoms, but she does have a goiter, which is quite large -We discussed that we may repeat another ultrasound at next visit if I feel that her goiter has enlarged. She does not feel that it has enlarged since I last saw her.  2.  Abnormal free thyroid hormones -She has a history of elevated T3, but more recently, during pregnancy she had slightly lower free T4 (please see HPI).  No intervention was needed for this.  This could be related to pregnancy or to the variation in the assay and not necessarily pathologic finding. -Of note, previous thyroid antibodies have not been elevated to point towards Hashimoto's thyroiditis -I would like to repeat her TFTs when she can return to the clinic (now coronavirus pandemic) -Otherwise, no intervention is necessary for now but I would like to repeat her TFTs in 1 year.  Orders Placed This Encounter  Procedures  . xtpit - TSH  . xtpit - free T4  . xtpit - free T3   Philemon Kingdom, MD PhD [facility] Endocrinology

## 2019-03-24 NOTE — Patient Instructions (Signed)
Please come back for labs at your convenience.  Please come back for a follow-up appointment in 1 year. 

## 2019-03-25 ENCOUNTER — Other Ambulatory Visit: Payer: BC Managed Care – PPO

## 2019-03-27 ENCOUNTER — Other Ambulatory Visit: Payer: Self-pay

## 2019-03-27 ENCOUNTER — Telehealth: Payer: Self-pay

## 2019-03-27 ENCOUNTER — Other Ambulatory Visit (INDEPENDENT_AMBULATORY_CARE_PROVIDER_SITE_OTHER): Payer: BC Managed Care – PPO

## 2019-03-27 DIAGNOSIS — E0590 Thyrotoxicosis, unspecified without thyrotoxic crisis or storm: Secondary | ICD-10-CM | POA: Diagnosis not present

## 2019-03-27 DIAGNOSIS — E042 Nontoxic multinodular goiter: Secondary | ICD-10-CM | POA: Diagnosis not present

## 2019-03-27 LAB — T4, FREE: Free T4: 0.86 ng/dL (ref 0.60–1.60)

## 2019-03-27 LAB — T3, FREE: T3, Free: 3.6 pg/mL (ref 2.3–4.2)

## 2019-03-27 LAB — TSH: TSH: 0.65 u[IU]/mL (ref 0.35–4.50)

## 2019-03-27 NOTE — Telephone Encounter (Signed)
-----   Message from Carlus Pavlov, MD sent at 03/27/2019  4:17 PM EST ----- Efraim Kaufmann, can you please call pt: all her TFTs are normal

## 2019-03-28 NOTE — Telephone Encounter (Signed)
Notified patient.

## 2019-12-23 ENCOUNTER — Telehealth: Payer: Self-pay

## 2019-12-23 ENCOUNTER — Ambulatory Visit: Payer: BC Managed Care – PPO | Admitting: Internal Medicine

## 2019-12-23 ENCOUNTER — Encounter: Payer: Self-pay | Admitting: Internal Medicine

## 2019-12-23 ENCOUNTER — Other Ambulatory Visit: Payer: Self-pay

## 2019-12-23 VITALS — BP 120/82 | HR 99 | Ht 64.0 in | Wt 175.6 lb

## 2019-12-23 DIAGNOSIS — E042 Nontoxic multinodular goiter: Secondary | ICD-10-CM | POA: Diagnosis not present

## 2019-12-23 DIAGNOSIS — E0590 Thyrotoxicosis, unspecified without thyrotoxic crisis or storm: Secondary | ICD-10-CM

## 2019-12-23 LAB — T4, FREE: Free T4: 0.64 ng/dL (ref 0.60–1.60)

## 2019-12-23 LAB — TSH: TSH: 0.63 u[IU]/mL (ref 0.35–4.50)

## 2019-12-23 LAB — T3, FREE: T3, Free: 3.9 pg/mL (ref 2.3–4.2)

## 2019-12-23 NOTE — Patient Instructions (Addendum)
Please stop at the lab.  Think about thyroid surgery.  Please come back for a follow-up appointment in 1 year.

## 2019-12-23 NOTE — Telephone Encounter (Signed)
Left message for patient to call back regarding results.

## 2019-12-23 NOTE — Telephone Encounter (Signed)
-----   Message from Carlus Pavlov, MD sent at 12/23/2019  1:59 PM EDT ----- Can you please call pt.:  Thyroid tests are all normal.

## 2019-12-23 NOTE — Progress Notes (Signed)
Patient ID: Meghan Jackson, female   DOB: 1980-04-18, 39 y.o.   MRN: [id number]   This visit occurred during the SARS-CoV-2 public health emergency.  Safety protocols were in place, including screening questions prior to the visit, additional usage of staff PPE, and extensive cleaning of exam room while observing appropriate contact time as indicated for disinfecting solutions.   HPI  Meghan Jackson is a 39 y.o.-year-old female, presenting for follow-up for thyroid nodules and also for history of elevated T3 and low free T4.  Last visit 8 months ago (virtual).  Reviewed and addended history: Patient has a history of 2 miscarriages and also having infertility treatments with Dr. Juliene Pina.  During investigation for the above problems, TSH was found to be normal but her T3 was elevated and she was referred back to endocrinology at that time.  In the months prior to our last appointment she had a normal TSH and a slightly low free T4.  The free T4 remains slightly low in 12/2018.  She also has a history of thyroid nodules that were discovered incidentally during investigation for infertility. At that time, on palpation, the thyroid was felt to be enlarged.  The thyroid ultrasound showed 2 dominant nodules, which were biopsied with benign results.    She had another thyroid ultrasound in 2019 that showed that the 2 nodules could have been areas of inflammation (pseudonodules).  Reviewed her imaging test report and biopsy results: Thyroid U/S (01/05/2016):  Isthmic 1.1 cm isoechoic nodule  Right mid 2.8 x 1.3 x 2.1 cm solid isoechoic nodule; few other small scattered cystic areas including a 0.6 cm predominately cystic nodule in the superior right lobe and a 0.5 cm partially cystic nodule in the inferior right lobe which are clearly low suspicion.  Left superior 3.1 x 1.9 x 3 cm solid, isoechoic, nodule  FNA of both dominant nodules (01/21/2016): Benign  Thyroid U/S (04/19/2017): Parenchymal  Echotexture: Markedly heterogenous Isthmus: Enlarged measuring 0.6 cm in diameter, unchanged Right lobe: Enlarged measuring 7.2 x 3.2 x 3.8 cm, unchanged, previously, 6.9 x 3.3 x 3.9 cm Left lobe: Enlarged measuring 8.0 x 2.9 x 3.6 cm, unchanged, previously, 6.8 x 3.0 x 3.7 cm __________________________________________  Nodule # 1: Prior biopsy: No Location: Isthmus; Mid Maximum size: 1.1 cm; Other 2 dimensions: 1.1 x 0.9 cm, previously, 1.1 x 1.1 x 0.9 cm Composition: solid/almost completely solid (2) Echogenicity: isoechoic (1)  Given size (<1.4 cm) and appearance, this nodule does NOT meet TI-RADS criteria for biopsy or dedicated follow-up. _________________________________________________________  There is a punctate (approximately 0.6 cm) hypoechoic nodule within the superior pole the right lobe of the thyroid which does not meet imaging criteria to recommend percutaneous sampling or dedicated follow-up  Previously biopsied ill-defined approximately 2.8 cm apparent nodule within the mid aspect of the right lobe of the thyroid is less conspicuous on the present examination and thus is favored to have represented a pseudo nodule. Correlation with prior biopsy results is recommended.  Previously biopsied 3.1 cm nodule/mass within the superior pole of the left lobe of the thyroid is less conspicuous on the present examination and thus favored to have represented a pseudo nodule. Correlation prior biopsy results is recommended.  IMPRESSION: 1. Similar findings of multinodular goiter. No definitive new or enlarging thyroid nodules. 2. Previously biopsied bilateral thyroid nodules are less conspicuous on the present examination and may have represented pseudo nodules. Correlation with prior biopsy results is recommended. Assuming benign pathologic diagnosis, repeat sampling and/or continued  dedicated follow-up is not recommended.  Pt denies: - feeling nodules in neck -  hoarseness - dysphagia - choking - SOB with lying down New neck pressure in last several months.  Reviewed her TFTs: Lab Results  Component Value Date   TSH 0.65 03/27/2019   TSH 0.67 09/19/2018   TSH 1.00 04/05/2017   TSH 0.401 07/07/2013   FREET4 0.86 03/27/2019   FREET4 0.55 (L) 09/19/2018   FREET4 0.81 04/05/2017   Lab Results  Component Value Date   T3FREE 3.6 03/27/2019   T3FREE 3.6 09/19/2018   T3FREE 4.1 04/05/2017  12/26/2018: TSH 1.24, Free T4 0.70 (0.82-1.77) 08/29/2018: TSH 0.57 12/25/2015: TSH 0.794, total T4 6.7  Her thyroid antibodies were not elevated: Component     Latest Ref Rng & Units 09/19/2018  Thyroperoxidase Ab SerPl-aCnc     <9 IU/mL 1  Thyroglobulin Ab     < or = 1 IU/mL <1  TSI     <140 % baseline <89  12/25/2015: TPO antibodies 15 (0-34)  No FH of thyroid disease or thyroid cancer. No h/o radiation tx to head or neck.  No herbal supplements. No Biotin use. No recent steroids use.   Pt also has a history of sepsis from a boil.  ROS: Constitutional: no weight gain/+ weight loss after pregnancy, no fatigue, no subjective hyperthermia, no subjective hypothermia Eyes: no blurry vision, no xerophthalmia ENT: no sore throat, + see HPI Cardiovascular: no CP/no SOB/no palpitations/no leg swelling Respiratory: no cough/no SOB/no wheezing Gastrointestinal: no N/no V/no D/no C/no acid reflux Musculoskeletal: no muscle aches/no joint aches Skin: no rashes, no hair loss Neurological: no tremors/no numbness/no tingling/no dizziness  I reviewed pt's medications, allergies, PMH, social hx, family hx, and changes were documented in the history of present illness. Otherwise, unchanged from my initial visit note.  Past Medical History:  Diagnosis Date  . Anal fissure   . Hemorrhoids   . Irregular heart rhythm    Past Surgical History:  Procedure Laterality Date  . NO PAST SURGERIES     Social History   Social History  . Marital status: Married      Spouse name: N/A  . Number of children: 0   Occupational History  . Internal trainer   Social History Main Topics  . Smoking status: Never Smoker  . Smokeless tobacco: Never Used  . Alcohol use     1 Glasses of wine Every 2 months      Comment: occ  . Drug use: No  . Sexual activity: Yes   Current Outpatient Medications on File Prior to Visit  Medication Sig Dispense Refill  . acetaminophen (TYLENOL) 500 MG tablet Take 500 mg by mouth every 6 (six) hours as needed.    . hydrocortisone-pramoxine (PROCTOFOAM-HC) rectal foam Place rectally 2 (two) times daily. 10 g 0  . Pramoxine-HC (HC PRAMOXINE EX) Apply topically.    . Prenat-FeFum-FePo-FA-Omega 3 (CONCEPT DHA) 53.5-38-1 MG CAPS TK ONE C PO QD     No current facility-administered medications on file prior to visit.   Allergies  Allergen Reactions  . Latex Itching and Other (See Comments)    dryness   Family History  Problem Relation Age of Onset  . Kidney disease Father   . Cancer Maternal Grandmother    PE: BP 120/82   Pulse 99   Ht 5\' 4"  (1.626 m)   Wt 175 lb 9.6 oz (79.7 kg)   SpO2 97%   BMI 30.14 kg/m  Wt Readings  from Last 3 Encounters:  12/23/19 175 lb 9.6 oz (79.7 kg)  01/30/19 208 lb 9.6 oz (94.6 kg)  01/02/19 205 lb (93 kg)   Constitutional: overweight, in NAD Eyes: PERRLA, EOMI, no exophthalmos ENT: moist mucous membranes, + very large goiter, symmetric, no cervical lymphadenopathy Cardiovascular: RRR, No MRG Respiratory: CTA B Gastrointestinal: abdomen soft, NT, ND, BS+ Musculoskeletal: no deformities, strength intact in all 4 Skin: moist, warm, no rashes Neurological: no tremor with outstretched hands, DTR normal in all 4  ASSESSMENT: 1. Multiple thyroid nodules  2. Abnormal free thyroid hormones   PLAN: 1. Multiple thyroid nodules -Patient has a large thyroid as seen on the latest thyroid ultrasound. The dominant nodules were not larger at last check, they were isoechoic, without  microcalcifications, and they appeared more as areas of inflammation (pseudonodules), therefore benign. Moreover, she had biopsies of these 2 nodules and these were benign. We discussed that no follow-up is necessary for these. She did have another, smaller, nodule on the ultrasound from 2019, which was stable in size but did not have a pseudonodular appearance. This was so small and also isoechoic, therefore, this is also low risk and no further investigation is required for this nodule, also. -At this visit, she tells me that she does have neck compression symptoms: She feels pressure in her neck which is exacerbated when she has tight clothes, not only around her neck, but also.  She feels that the pressure in her neck increased in the last few months.  She also feels some burning on the side of the neck, which is transient. -At today's visit we discussed about the fact that the only option to relieve her pressure in her neck is actually thyroidectomy.  As of now, she can use ibuprofen and she also applies warm, but without significant relief.  In her case, due to the large size of her thyroid, which was even larger on the last ultrasound, I do suggest thyroidectomy.  She would like to think about it.  She is planning to have another baby within the next 6 months and I strongly advised her for thyroidectomy only after the pregnancy, in case she agrees with it. -I do not feel we absolutely need to repeat the ultrasound for now  2.  Abnormal free thyroid hormones -She has a history of elevated T3, but, during pregnancy, she had a slightly low free T4 (see HPI). No intervention was needed for this. This could have been related to pregnancy or to the variation in the assay and not necessarily a pathologic finding. -Of note, previous thyroid antibodies have not been elevated, so no signs of Hashimoto's thyroiditis -the entire thyroid panel was normal in 03/2019. -We will repeat her TFTs today and will need to  repeat them right after she is pregnant -I will see her back in a year   Component     Latest Ref Rng & Units 12/23/2019  TSH     0.35 - 4.50 uIU/mL 0.63  T4,Free(Direct)     0.60 - 1.60 ng/dL 4.09  Triiodothyronine,Free,Serum     2.3 - 4.2 pg/mL 3.9  TFTs are normal.  Carlus Pavlov, MD PhD [facility] Endocrinology

## 2019-12-24 ENCOUNTER — Telehealth: Payer: Self-pay

## 2019-12-24 NOTE — Telephone Encounter (Signed)
-----   Message from Cristina Gherghe, MD sent at 12/23/2019  1:59 PM EDT ----- Can you please call pt.:  Thyroid tests are all normal.  

## 2019-12-24 NOTE — Telephone Encounter (Signed)
Patient informed of results.  

## 2020-01-16 ENCOUNTER — Ambulatory Visit: Payer: BC Managed Care – PPO | Admitting: Internal Medicine

## 2020-02-20 ENCOUNTER — Other Ambulatory Visit: Payer: Self-pay | Admitting: Internal Medicine

## 2020-02-20 DIAGNOSIS — Z1231 Encounter for screening mammogram for malignant neoplasm of breast: Secondary | ICD-10-CM

## 2020-04-06 ENCOUNTER — Ambulatory Visit: Payer: BC Managed Care – PPO

## 2020-04-23 ENCOUNTER — Ambulatory Visit
Admission: RE | Admit: 2020-04-23 | Discharge: 2020-04-23 | Disposition: A | Discharge status: Home / Self Care | Payer: BC Managed Care – PPO | Source: Ambulatory Visit | Attending: Internal Medicine | Admitting: Internal Medicine

## 2020-04-23 ENCOUNTER — Other Ambulatory Visit: Payer: Self-pay

## 2020-04-23 DIAGNOSIS — Z1231 Encounter for screening mammogram for malignant neoplasm of breast: Secondary | ICD-10-CM

## 2020-06-29 IMAGING — US US MFM OB LIMITED
1 series · 15 of 26 positions shown · non-contrast
Comparison: none

[Series 1: us mfm ob limited · 15 of 26 slices shown]
[im 1/26]
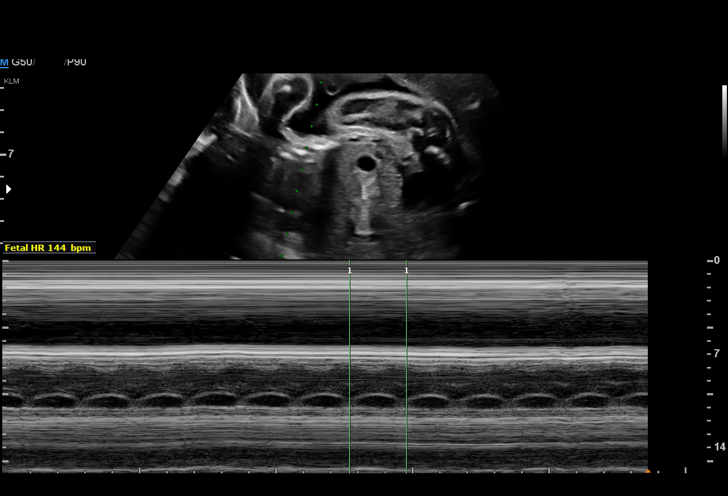
[im 3/26]
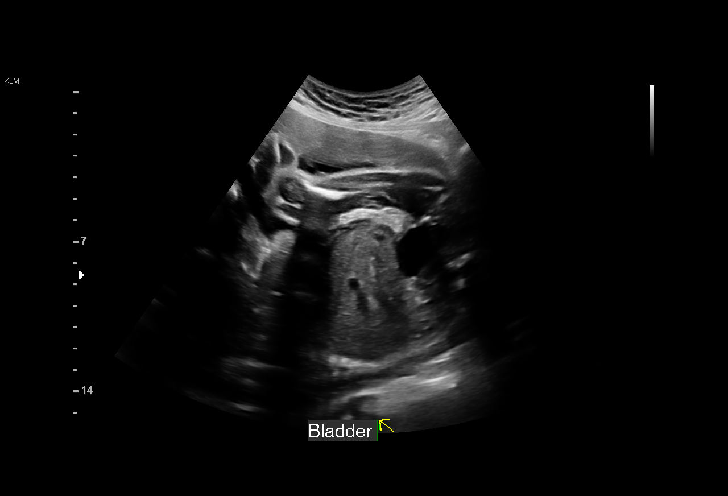
[im 5/26]
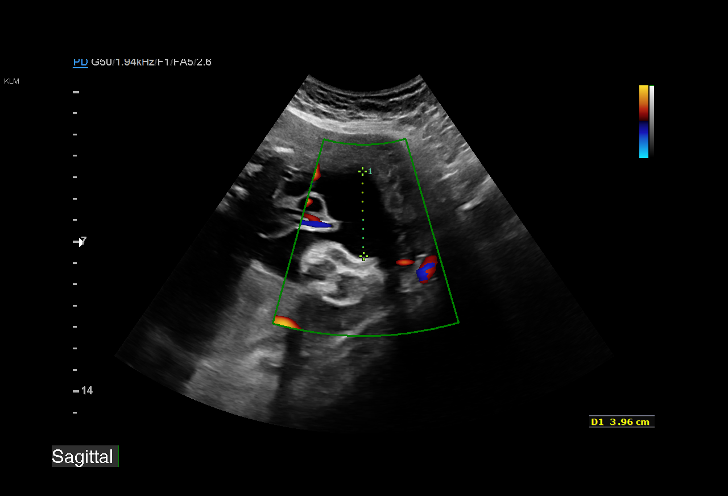
[im 7/26]
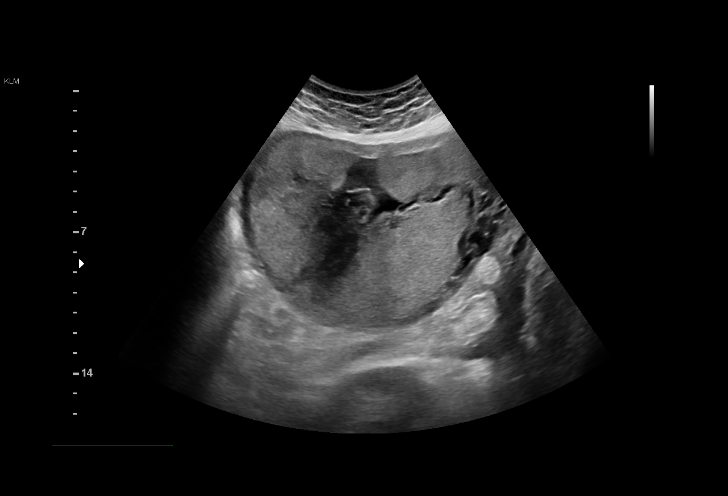
[im 8/26]
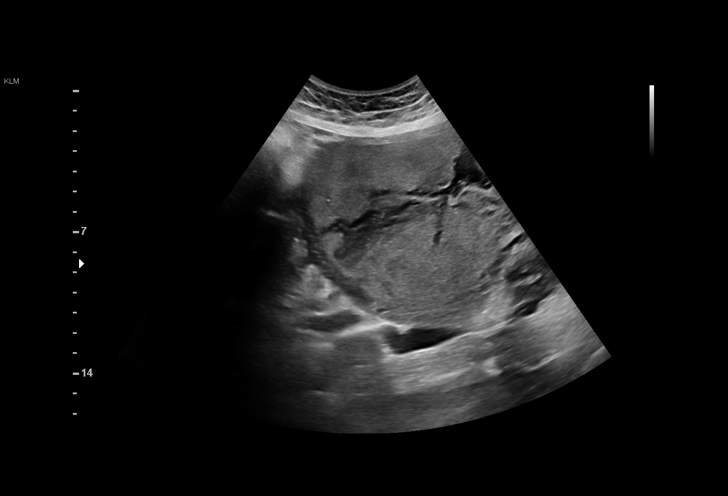
[im 10/26]
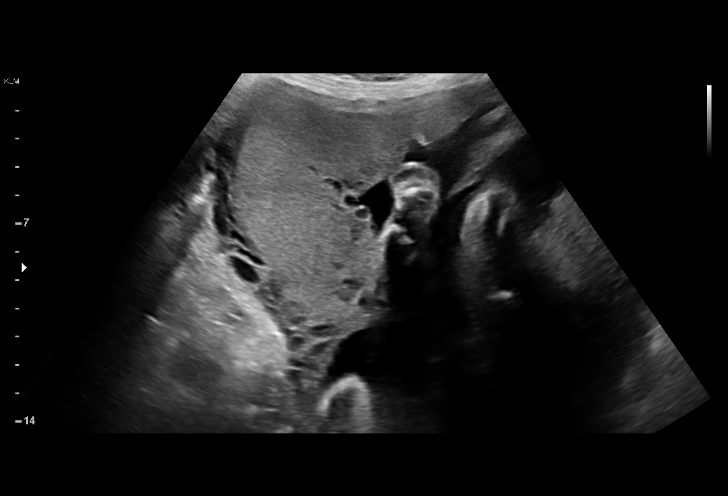
[im 12/26]
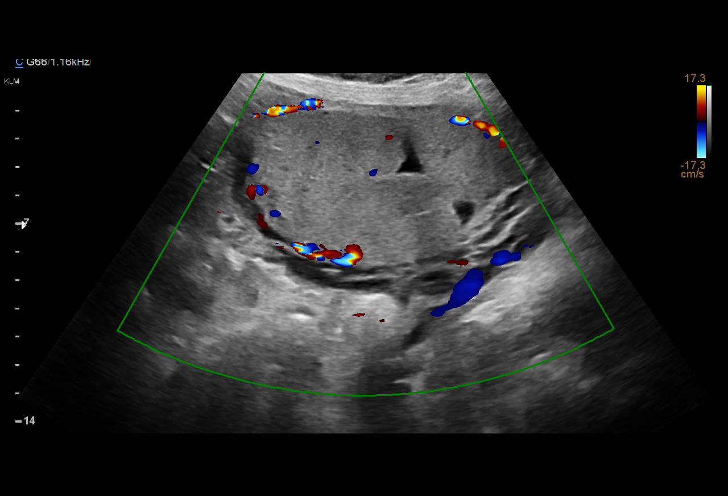
[im 14/26]
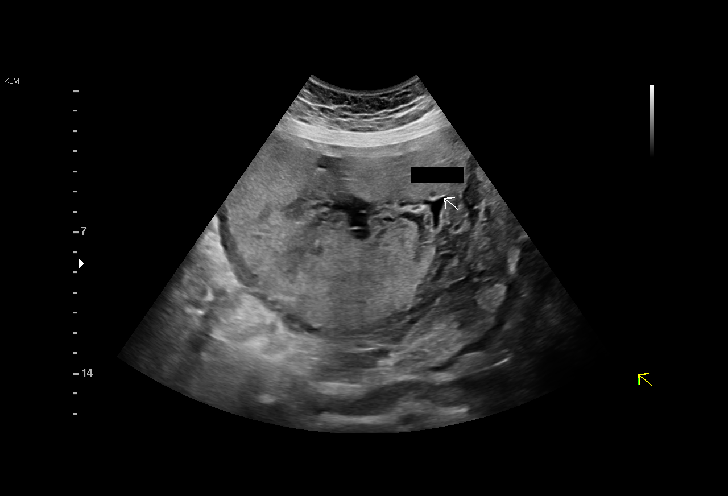
[im 15/26]
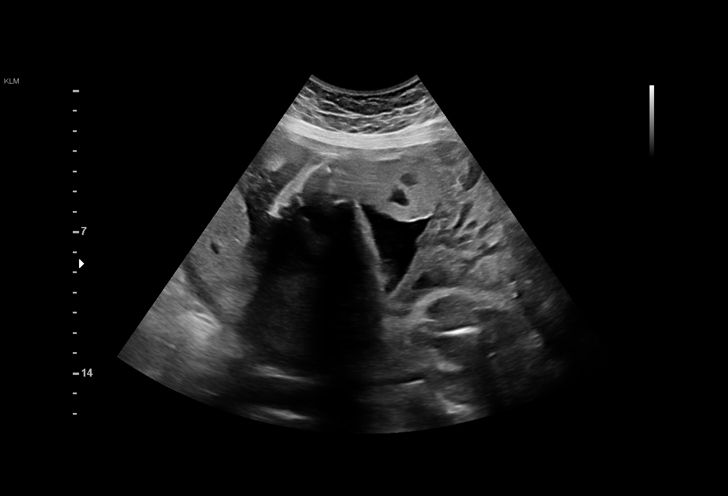
[im 17/26]
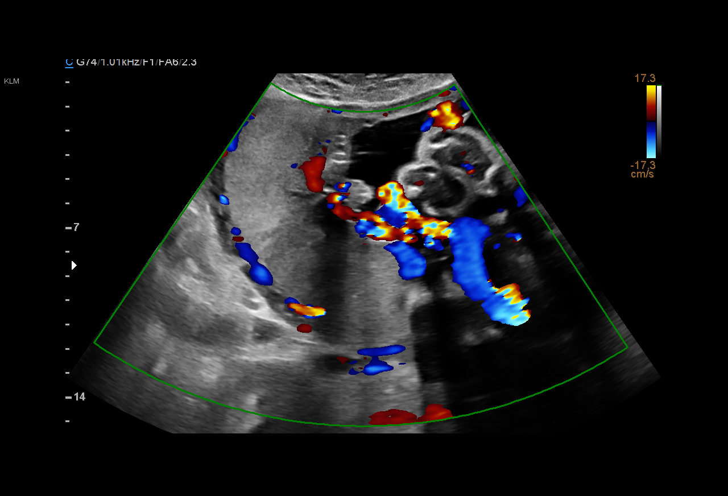
[im 19/26]
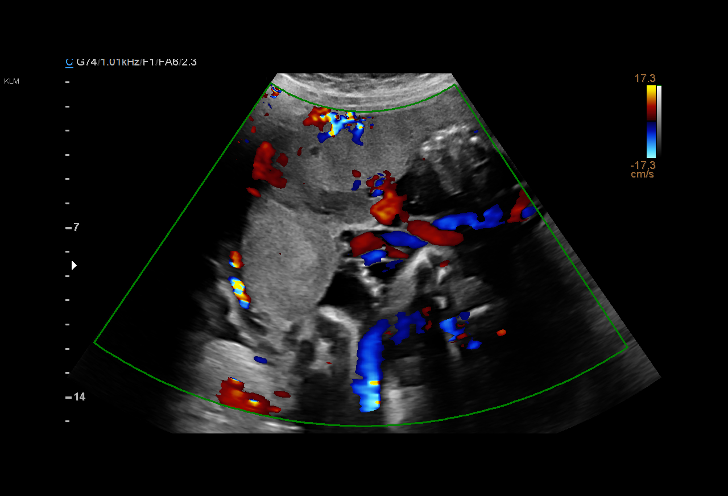
[im 20/26]
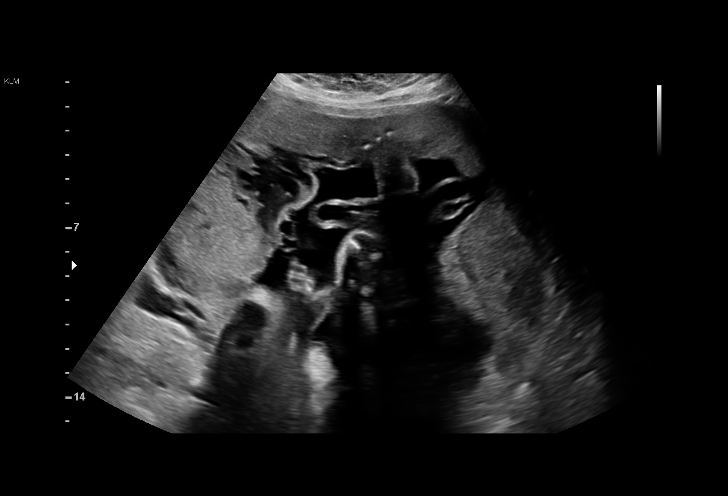
[im 22/26]
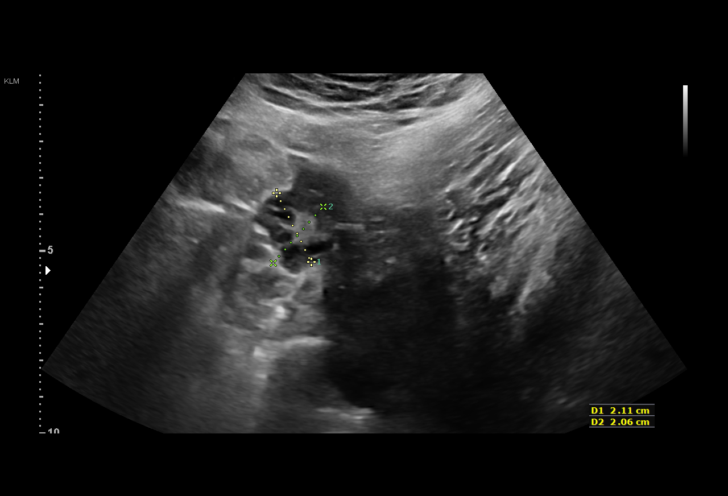
[im 24/26]
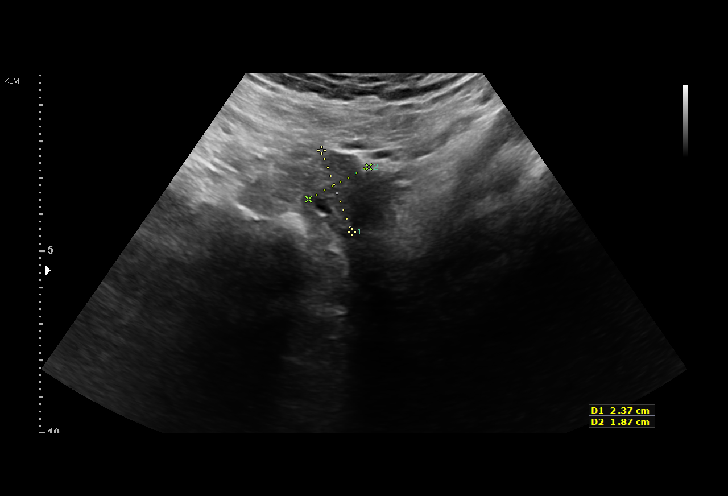
[im 26/26]
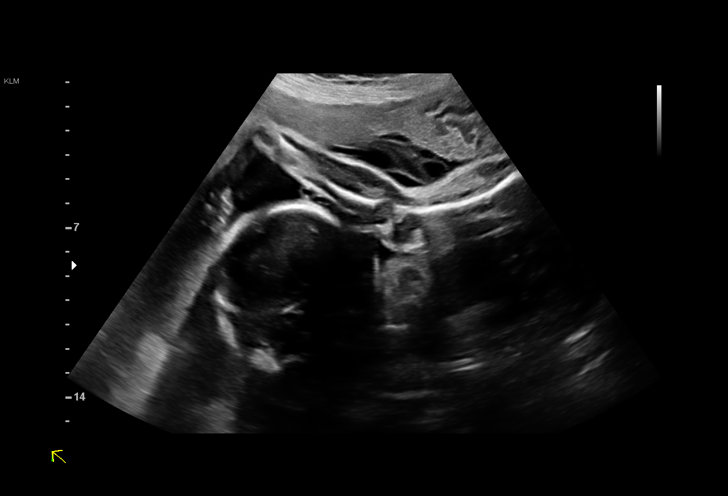

[15 of 26 positions shown; findings below may reference images not displayed]

MAU/Triage

  1  US MFM OB LIMITED                    76815.01     FERIENHAUS ERXLEBEN
 ----------------------------------------------------------------------

 ----------------------------------------------------------------------
Indications

  Traumatic injury during pregnancy (fall)
  27 weeks gestation of pregnancy
  Advanced maternal age multigravida 35+,
  second trimester
 ----------------------------------------------------------------------
Fetal Evaluation

 Num Of Fetuses:          1
 Fetal Heart Rate(bpm):   144
 Cardiac Activity:        Observed
 Presentation:            Frank breech
 Placenta:                Succenturiate lobe, posterior
 P. Cord Insertion:       Visualized

 Amniotic Fluid
 AFI FV:      Within normal limits

                             Largest Pocket(cm)


 Comment:    No placental abruption or previa identified.
OB History

 Gravidity:    1         Term:   0        Prem:   0        SAB:   0
 TOP:          0       Ectopic:  0        Living: 0
Gestational Age

 Clinical EDD:  27w 6d                                        EDD:   02/18/19
 Best:          27w 6d     Det. By:  Clinical EDD             EDD:   02/18/19
Anatomy
 Stomach:               Appears normal, left   Bladder:                Appears normal
                        sided
Cervix Uterus Adnexa

 Cervix
 Not visualized (advanced GA >43wks)

 Uterus
 No abnormality visualized.

 Left Ovary
 Within normal limits.

 Right Ovary
 Within normal limits.

 Adnexa
 No abnormality visualized.
Impression

 History of fall. No history of vaginal bleeding.
 A limited ultrasound study was performed. Amniotic fluid is
 normal and good fetal activity is seen. Placenta looks normal
 with no evidence of abruption. Ultrasound has limitations in
 diagnosing placental abruption.
                 Gauna, Do

## 2020-11-08 ENCOUNTER — Other Ambulatory Visit: Payer: Self-pay | Admitting: Internal Medicine

## 2020-11-08 DIAGNOSIS — E042 Nontoxic multinodular goiter: Secondary | ICD-10-CM

## 2020-11-12 ENCOUNTER — Other Ambulatory Visit: Payer: Self-pay

## 2020-11-12 ENCOUNTER — Ambulatory Visit
Admission: RE | Admit: 2020-11-12 | Discharge: 2020-11-12 | Disposition: A | Discharge status: Home / Self Care | Payer: BC Managed Care – PPO | Source: Ambulatory Visit | Attending: Internal Medicine | Admitting: Internal Medicine

## 2020-11-12 DIAGNOSIS — E042 Nontoxic multinodular goiter: Secondary | ICD-10-CM

## 2020-12-22 ENCOUNTER — Ambulatory Visit: Payer: BC Managed Care – PPO | Admitting: Internal Medicine

## 2020-12-22 ENCOUNTER — Other Ambulatory Visit: Payer: Self-pay

## 2020-12-22 ENCOUNTER — Encounter: Payer: Self-pay | Admitting: Internal Medicine

## 2020-12-22 VITALS — BP 116/82 | Ht 64.0 in | Wt 159.6 lb

## 2020-12-22 DIAGNOSIS — E042 Nontoxic multinodular goiter: Secondary | ICD-10-CM | POA: Diagnosis not present

## 2020-12-22 DIAGNOSIS — E0590 Thyrotoxicosis, unspecified without thyrotoxic crisis or storm: Secondary | ICD-10-CM

## 2020-12-22 LAB — T3, FREE: T3, Free: 3.3 pg/mL (ref 2.3–4.2)

## 2020-12-22 LAB — TSH: TSH: 0.69 u[IU]/mL (ref 0.35–5.50)

## 2020-12-22 LAB — T4, FREE: Free T4: 0.65 ng/dL (ref 0.60–1.60)

## 2020-12-22 NOTE — Patient Instructions (Addendum)
Please stop at the lab.  Please come back for a follow-up appointment in 1 year.  

## 2020-12-22 NOTE — Progress Notes (Signed)
Patient ID: Meghan Jackson, female   DOB: March 06, 1981, 40 y.o.   MRN: [id number]   This visit occurred during the SARS-CoV-2 public health emergency.  Safety protocols were in place, including screening questions prior to the visit, additional usage of staff PPE, and extensive cleaning of exam room while observing appropriate contact time as indicated for disinfecting solutions.   HPI  Meghan Jackson is a 40 y.o.-year-old female, presenting for follow-up for thyroid nodules and also for history of elevated T3 and low free T4.  Last visit 1 year ago.  Interim history: She recently had another thyroid ultrasound ordered by PCP which was essentially stable from before. She lost 16 more lbs since last OV after she started to walk 4 miles 4 times a week. She feels her neck sxs. Have improved after the weight loss.  Reviewed history: Patient has a history of 2 miscarriages and also having infertility treatments with Dr. Juliene Pina.  During investigation for the above problems, TSH was found to be normal but her T3 was elevated and she was referred back to endocrinology at that time.  In the months prior to our last appointment she had a normal TSH and a slightly low free T4.  The free T4 remains slightly low in 12/2018.  She also has a history of thyroid nodules that were discovered incidentally during investigation for infertility. At that time, on palpation, the thyroid was felt to be enlarged.  The thyroid ultrasound showed 2 dominant nodules, which were biopsied with benign results.    She had another thyroid ultrasound in 2019 that showed that the 2 nodules could have been areas of inflammation (pseudonodules).  Reviewed her imaging test report and biopsy results: Thyroid U/S (01/05/2016): Isthmic 1.1 cm isoechoic nodule Right mid 2.8 x 1.3 x 2.1 cm solid isoechoic nodule; few other small scattered cystic areas including a 0.6 cm predominately cystic nodule in the superior right lobe and a 0.5 cm  partially cystic nodule in the inferior right lobe which are clearly low suspicion. Left superior 3.1 x 1.9 x 3 cm solid, isoechoic, nodule  FNA of both dominant nodules (01/21/2016): Benign  Thyroid U/S (04/19/2017): Parenchymal Echotexture: Markedly heterogenous Isthmus: Enlarged measuring 0.6 cm in diameter, unchanged Right lobe: Enlarged measuring 7.2 x 3.2 x 3.8 cm, unchanged, previously, 6.9 x 3.3 x 3.9 cm Left lobe: Enlarged measuring 8.0 x 2.9 x 3.6 cm, unchanged, previously, 6.8 x 3.0 x 3.7 cm __________________________________________  Nodule # 1: Prior biopsy: No Location: Isthmus; Mid Maximum size: 1.1 cm; Other 2 dimensions: 1.1 x 0.9 cm, previously, 1.1 x 1.1 x 0.9 cm Composition: solid/almost completely solid (2) Echogenicity: isoechoic (1)  Given size (<1.4 cm) and appearance, this nodule does NOT meet TI-RADS criteria for biopsy or dedicated follow-up. _________________________________________________________  There is a punctate (approximately 0.6 cm) hypoechoic nodule within the superior pole the right lobe of the thyroid which does not meet imaging criteria to recommend percutaneous sampling or dedicated follow-up  Previously biopsied ill-defined approximately 2.8 cm apparent nodule within the mid aspect of the right lobe of the thyroid is less conspicuous on the present examination and thus is favored to have represented a pseudo nodule. Correlation with prior biopsy results is recommended.  Previously biopsied 3.1 cm nodule/mass within the superior pole of the left lobe of the thyroid is less conspicuous on the present examination and thus favored to have represented a pseudo nodule. Correlation prior biopsy results is recommended.  IMPRESSION: 1. Similar findings of multinodular  goiter. No definitive new or enlarging thyroid nodules. 2. Previously biopsied bilateral thyroid nodules are less conspicuous on the present examination and may have  represented pseudo nodules. Correlation with prior biopsy results is recommended. Assuming benign pathologic diagnosis, repeat sampling and/or continued dedicated follow-up is not recommended.  Thyroid U/S (11/12/2020): Stable nodules, but thyroid gland was enlarged Parenchymal Echotexture: Mildly heterogenous Isthmus: Enlarged measuring 1.2 cm in diameter  Right lobe: Enlarged measuring 7.7 x 3.0 x 4.5 cm, previously, 7.2 x 3.2 x 3.8 cm  Left lobe: Enlarged measuring 8.8 x 3.9 x 4.6 cm, previously, 8.0 x 2.9 x 3.6 cm  _________________________________________________________   Previously questioned 1.1 cm isoechoic ill-defined nodule within the thyroid isthmus is not definitely seen on the present examination and thus favored to have represented a pseudonodule.  _________________________________________________________   The previously biopsied approximately 2.6 x 2.5 x 1.9 cm partially cystic though predominantly solid isoechoic ill-defined nodule within the mid aspect the right lobe of the thyroid (labeled 1), is grossly unchanged compared to the 12/2015 examination, previously, 2.8 cm in diameter. Correlation with previous biopsy results is advised.  _________________________________________________________   Previously biopsied approximately 4.1 x 2.9 x 2.3 cm partially cystic though predominantly solid isoechoic ill-defined nodule within the mid aspect the left lobe of the thyroid (labeled 2) has minimally increased in size compared to the 12/2015 examination, previously, 3.2 cm, with slight size differences likely attributable to a combination of interval partial cystic degeneration (a typically benign finding) as well as accentuated by the ill-defined borders of the nodule. Correlation with previous biopsy results is advised.   IMPRESSION: 1. Similar findings of thyromegaly and multinodular goiter. No worrisome new or enlarging thyroid nodules. 2. Previously biopsied  bilateral thyroid nodules appear grossly unchanged compared to the 12/2015 examination. Correlation with previous biopsy results is advised. Additionally, relative imaging stability for the past 5 years is indicative of a benign etiology.  Pt denies: - hoarseness - dysphagia - choking - SOB with lying down She continues to have neck pressure, but improved.  Reviewed her TFTs: Lab Results  Component Value Date   TSH 0.63 12/23/2019   TSH 0.65 03/27/2019   TSH 0.67 09/19/2018   TSH 1.00 04/05/2017   TSH 0.401 07/07/2013   FREET4 0.64 12/23/2019   FREET4 0.86 03/27/2019   FREET4 0.55 (L) 09/19/2018   FREET4 0.81 04/05/2017   Lab Results  Component Value Date   T3FREE 3.9 12/23/2019   T3FREE 3.6 03/27/2019   T3FREE 3.6 09/19/2018   T3FREE 4.1 04/05/2017  12/26/2018: TSH 1.24, Free T4 0.70 (0.82-1.77) 08/29/2018: TSH 0.57 12/25/2015: TSH 0.794, total T4 6.7  Her thyroid antibodies were not elevated: Component     Latest Ref Rng & Units 09/19/2018  Thyroperoxidase Ab SerPl-aCnc     <9 IU/mL 1  Thyroglobulin Ab     < or = 1 IU/mL <1  TSI     <140 % baseline <89  12/25/2015: TPO antibodies 15 (0-34)  No FH of thyroid disease or thyroid cancer. No h/o radiation tx to head or neck.  No herbal supplements. No Biotin use. No recent steroids use.   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  ROS: + See HPI  I reviewed pt's medications, allergies, PMH, social hx, family hx, and changes were documented in the history of present illness. Otherwise, unchanged from my initial visit note.  Past Medical History:  Diagnosis Date   Anal fissure    Hemorrhoids    Irregular  heart rhythm    Past Surgical History:  Procedure Laterality Date   NO PAST SURGERIES     Social History   Social History   Marital status: Married     Spouse name: N/A   Number of children: 0   Occupational History   Internal trainer   Social History Main Topics   Smoking status: Never  Smoker   Smokeless tobacco: Never Used   Alcohol use     1 Glasses of wine Every 2 months      Comment: occ   Drug use: No   Sexual activity: Yes   No current outpatient medications on file prior to visit.   No current facility-administered medications on file prior to visit.   Allergies  Allergen Reactions   Latex Itching and Other (See Comments)    dryness   Family History  Problem Relation Age of Onset   Kidney disease Father    Cancer Maternal Grandmother    PE: BP 116/82 (BP Location: Right Arm, Patient Position: Sitting, Cuff Size: Normal)   Ht 5\' 4"  (1.626 m)   Wt 159 lb 9.6 oz (72.4 kg)   SpO2 99%   BMI 27.40 kg/m  Wt Readings from Last 3 Encounters:  12/22/20 159 lb 9.6 oz (72.4 kg)  12/23/19 175 lb 9.6 oz (79.7 kg)  01/30/19 208 lb 9.6 oz (94.6 kg)   Constitutional: overweight, in NAD Eyes: PERRLA, EOMI, no exophthalmos ENT: moist mucous membranes, + very large goiter, symmetric, no cervical lymphadenopathy Cardiovascular: RRR, No MRG Respiratory: CTA B Gastrointestinal: abdomen soft, NT, ND, BS+ Musculoskeletal: no deformities, strength intact in all 4 Skin: moist, warm, no rashes Neurological: no tremor with outstretched hands, DTR normal in all 4  ASSESSMENT: 1. Multiple thyroid nodules  2. Abnormal free thyroid hormones   PLAN: 1. Multiple thyroid nodules -Patient is at large thyroid as seen on the latest thyroid ultrasound.  Dominant nodules were not larger at last check, and they did not have high risk features: They were isoechoic, without microcalcifications and,  importantly, they appeared as areas of inflammation (or nodules), which indicates benignity.  Moreover, she had these 2 nodules biopsied and those were benign.  We discussed that no follow-up is needed for these.  She did have another, smaller, nodule on the ultrasound from 2019, which was stable in size but did not have the pseudo nodular appearance.  This was quite small and also  isoechoic, therefore low risk so no further investigation was required for this nodule, also -However, at last visit, she had neck compression symptoms: She felt pressure in her neck exacerbated by tight clothes and felt that the pressure was increasing.  She had some burning on the side of the neck, which was transient.  Therefore, at last visit, I suggested total thyroidectomy as definitive treatment.  We did discuss about the use of anti-inflammatory medication for symptom relief.  At that time, she was planning to have another baby within the following 6 months and we discussed about proceeding with thyroidectomy only after pregnancy. -At last visit we discussed that the new ultrasound was not absolutely needed.  However, this was ordered by PCP and she had it on 11/12/2020.  This showed stable nodules, without changes.  Since these findings were replicated over 5 years, no further ultrasound are needed.  The new ultrasound, however, showed that the thyroid gland was even slightly larger, which correlates with her neck compression symptoms therefore, at this visit, I reiterated my recommendation  for total thyroidectomy, but only after a new pregnancy.  She would like to have another baby within the next year.  2.  Abnormal free thyroid hormones -Patient has a history of elevated T3, but during the pregnancy, she had a slightly low free T4 (see HPI).  No intervention was needed for this.  This could have been related to pregnancy or to assay variation and not necessarily a pathologic finding. -Of note, previous thyroid antibodies were not high, so we do not have evidence of Hashimoto's thyroiditis -Her TFTs were all normal in 03/2019 and also in 12/2019, at last visit. -We will repeat her TFTs today - she lost 16 lbs since last OV, but no sxs of thyrotoxicosis at today's visit.  She mentions that the weight loss was due to her starting to walk 4 miles 4 times a week. -I will see her back in 1 year, but I  did advise her to let me know if she gets pregnant and, in which case, we will need to repeat her TFTs.  Component     Latest Ref Rng & Units 12/22/2020  TSH     0.35 - 5.50 uIU/mL 0.69  T4,Free(Direct)     0.60 - 1.60 ng/dL 8.72  Triiodothyronine,Free,Serum     2.3 - 4.2 pg/mL 3.3   Thyroid tests are normal. Carlus Pavlov, MD PhD [facility]

## 2021-12-26 ENCOUNTER — Ambulatory Visit: Payer: BC Managed Care – PPO | Admitting: Internal Medicine

## 2021-12-26 ENCOUNTER — Encounter: Payer: Self-pay | Admitting: Internal Medicine

## 2021-12-26 VITALS — BP 110/62 | HR 83 | Ht 64.0 in | Wt 174.0 lb

## 2021-12-26 DIAGNOSIS — E0590 Thyrotoxicosis, unspecified without thyrotoxic crisis or storm: Secondary | ICD-10-CM | POA: Diagnosis not present

## 2021-12-26 DIAGNOSIS — E042 Nontoxic multinodular goiter: Secondary | ICD-10-CM | POA: Diagnosis not present

## 2021-12-26 LAB — T3, FREE: T3, Free: 4.2 pg/mL (ref 2.3–4.2)

## 2021-12-26 LAB — T4, FREE: Free T4: 0.75 ng/dL (ref 0.60–1.60)

## 2021-12-26 LAB — TSH: TSH: 0.81 u[IU]/mL (ref 0.35–5.50)

## 2021-12-26 NOTE — Patient Instructions (Signed)
Please stop at the lab.  Please come back for a follow-up appointment in 1 year.  

## 2021-12-26 NOTE — Progress Notes (Signed)
Patient ID: Meghan Jackson, female   DOB: Mar 19, 1980, 41 y.o.   MRN: [id number]   HPI  Meghan Jackson is a 41 y.o.-year-old female, presenting for follow-up for thyroid nodules and also for history of elevated T3 and low free T4.  Last visit 1 year ago.  Interim history: Before last visit, she lost 16 more lbs  after she started to walk 4 miles 4 times a week. She did have some pressure in her neck which improved after weight loss. At today's visit, he has occasional pressure in the neck, which is intermittent and not very bothersome.  She did gain some of the weight back. She is finishing her Masters degree next month.  Afterwards, she is contemplating a pregnancy within the next 1 to 2 years.  Reviewed history: Patient has a history of 2 miscarriages and also having infertility treatments with Dr. Juliene Pina.  During investigation for the above problems, TSH was found to be normal but her T3 was elevated and she was referred back to endocrinology at that time.  In the months prior to our last appointment she had a normal TSH and a slightly low free T4.  The free T4 remains slightly low in 12/2018.  She also has a history of thyroid nodules that were discovered incidentally during investigation for infertility. At that time, on palpation, the thyroid was felt to be enlarged.  The thyroid ultrasound showed 2 dominant nodules, which were biopsied with benign results.    She had another thyroid ultrasound in 2019 that showed that the 2 nodules could have been areas of inflammation (pseudonodules).  Reviewed her imaging test report and biopsy results: Thyroid U/S (01/05/2016): Isthmic 1.1 cm isoechoic nodule Right mid 2.8 x 1.3 x 2.1 cm solid isoechoic nodule; few other small scattered cystic areas including a 0.6 cm predominately cystic nodule in the superior right lobe and a 0.5 cm partially cystic nodule in the inferior right lobe which are clearly low suspicion. Left superior 3.1 x 1.9 x 3 cm  solid, isoechoic, nodule  FNA of both dominant nodules (01/21/2016): Benign  Thyroid U/S (04/19/2017): Parenchymal Echotexture: Markedly heterogenous Isthmus: Enlarged measuring 0.6 cm in diameter, unchanged Right lobe: Enlarged measuring 7.2 x 3.2 x 3.8 cm, unchanged, previously, 6.9 x 3.3 x 3.9 cm Left lobe: Enlarged measuring 8.0 x 2.9 x 3.6 cm, unchanged, previously, 6.8 x 3.0 x 3.7 cm __________________________________________  Nodule # 1: Prior biopsy: No Location: Isthmus; Mid Maximum size: 1.1 cm; Other 2 dimensions: 1.1 x 0.9 cm, previously, 1.1 x 1.1 x 0.9 cm Composition: solid/almost completely solid (2) Echogenicity: isoechoic (1)  Given size (<1.4 cm) and appearance, this nodule does NOT meet TI-RADS criteria for biopsy or dedicated follow-up. _________________________________________________________  There is a punctate (approximately 0.6 cm) hypoechoic nodule within the superior pole the right lobe of the thyroid which does not meet imaging criteria to recommend percutaneous sampling or dedicated follow-up  Previously biopsied ill-defined approximately 2.8 cm apparent nodule within the mid aspect of the right lobe of the thyroid is less conspicuous on the present examination and thus is favored to have represented a pseudo nodule. Correlation with prior biopsy results is recommended.  Previously biopsied 3.1 cm nodule/mass within the superior pole of the left lobe of the thyroid is less conspicuous on the present examination and thus favored to have represented a pseudo nodule. Correlation prior biopsy results is recommended.  IMPRESSION: 1. Similar findings of multinodular goiter. No definitive new or enlarging thyroid nodules. 2.  Previously biopsied bilateral thyroid nodules are less conspicuous on the present examination and may have represented pseudo nodules. Correlation with prior biopsy results is recommended. Assuming benign pathologic diagnosis, repeat  sampling and/or continued dedicated follow-up is not recommended.  Thyroid U/S (11/12/2020): Stable nodules, but thyroid gland was enlarged Parenchymal Echotexture: Mildly heterogenous Isthmus: Enlarged measuring 1.2 cm in diameter  Right lobe: Enlarged measuring 7.7 x 3.0 x 4.5 cm, previously, 7.2 x 3.2 x 3.8 cm  Left lobe: Enlarged measuring 8.8 x 3.9 x 4.6 cm, previously, 8.0 x 2.9 x 3.6 cm  _________________________________________________________   Previously questioned 1.1 cm isoechoic ill-defined nodule within the thyroid isthmus is not definitely seen on the present examination and thus favored to have represented a pseudonodule.  _________________________________________________________   The previously biopsied approximately 2.6 x 2.5 x 1.9 cm partially cystic though predominantly solid isoechoic ill-defined nodule within the mid aspect the right lobe of the thyroid (labeled 1), is grossly unchanged compared to the 12/2015 examination, previously, 2.8 cm in diameter. Correlation with previous biopsy results is advised.  _________________________________________________________   Previously biopsied approximately 4.1 x 2.9 x 2.3 cm partially cystic though predominantly solid isoechoic ill-defined nodule within the mid aspect the left lobe of the thyroid (labeled 2) has minimally increased in size compared to the 12/2015 examination, previously, 3.2 cm, with slight size differences likely attributable to a combination of interval partial cystic degeneration (a typically benign finding) as well as accentuated by the ill-defined borders of the nodule. Correlation with previous biopsy results is advised.   IMPRESSION: 1. Similar findings of thyromegaly and multinodular goiter. No worrisome new or enlarging thyroid nodules. 2. Previously biopsied bilateral thyroid nodules appear grossly unchanged compared to the 12/2015 examination. Correlation with previous biopsy results is  advised. Additionally, relative imaging stability for the past 5 years is indicative of a benign etiology.  Pt denies: - hoarseness - dysphagia - choking - SOB with lying down She continues to have neck pressure.  Reviewed her TFTs: Lab Results  Component Value Date   TSH 0.69 12/22/2020   TSH 0.63 12/23/2019   TSH 0.65 03/27/2019   TSH 0.67 09/19/2018   TSH 1.00 04/05/2017   TSH 0.401 07/07/2013   FREET4 0.65 12/22/2020   FREET4 0.64 12/23/2019   FREET4 0.86 03/27/2019   FREET4 0.55 (L) 09/19/2018   FREET4 0.81 04/05/2017   Lab Results  Component Value Date   T3FREE 3.3 12/22/2020   T3FREE 3.9 12/23/2019   T3FREE 3.6 03/27/2019   T3FREE 3.6 09/19/2018   T3FREE 4.1 04/05/2017  12/26/2018: TSH 1.24, Free T4 0.70 (0.82-1.77) 08/29/2018: TSH 0.57 12/25/2015: TSH 0.794, total T4 6.7  Her thyroid antibodies were not elevated: Component     Latest Ref Rng & Units 09/19/2018  Thyroperoxidase Ab SerPl-aCnc     <9 IU/mL 1  Thyroglobulin Ab     < or = 1 IU/mL <1  TSI     <140 % baseline <89  12/25/2015: TPO antibodies 15 (0-34)  No FH of thyroid disease or thyroid cancer. No h/o radiation tx to head or neck. No herbal supplements. No Biotin use. No recent steroids use.   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  ROS: + See HPI  I reviewed pt's medications, allergies, PMH, social hx, family hx, and changes were documented in the history of present illness. Otherwise, unchanged from my initial visit note.  Past Medical History:  Diagnosis Date   Anal fissure    Hemorrhoids  Irregular heart rhythm    Past Surgical History:  Procedure Laterality Date   NO PAST SURGERIES     Social History   Social History   Marital status: Married     Spouse name: N/A   Number of children: 0   Occupational History   Internal trainer   Social History Main Topics   Smoking status: Never Smoker   Smokeless tobacco: Never Used   Alcohol use     1 Glasses of wine  Every 2 months      Comment: occ   Drug use: No   Sexual activity: Yes   No current outpatient medications on file prior to visit.   No current facility-administered medications on file prior to visit.   Allergies  Allergen Reactions   Latex Itching and Other (See Comments)    dryness   Family History  Problem Relation Age of Onset   Kidney disease Father    Cancer Maternal Grandmother    PE: BP 110/62 (BP Location: Right Arm, Patient Position: Sitting, Cuff Size: Normal)   Pulse 83   Ht 5\' 4"  (1.626 m)   Wt 174 lb (78.9 kg)   SpO2 99%   BMI 29.87 kg/m  Wt Readings from Last 3 Encounters:  12/26/21 174 lb (78.9 kg)  12/22/20 159 lb 9.6 oz (72.4 kg)  12/23/19 175 lb 9.6 oz (79.7 kg)   Constitutional: overweight, in NAD Eyes: EOMI, no exophthalmos ENT: + very large goiter, symmetric, no cervical lymphadenopathy Cardiovascular: RRR, No MRG Respiratory: CTA B Musculoskeletal: no deformities Skin: moist, warm, no rashes Neurological: no tremor with outstretched hands  ASSESSMENT: 1. Multiple thyroid nodules  2. Abnormal free thyroid hormones   PLAN: 1. Multiple thyroid nodules -Patient has a large thyroid seen on thyroid ultrasound.  Dominant nodules were not larger at last check and they did not have high risk features: They were isoechoic, without microcalcifications, and, importantly, they appear less areas of inflammation (pseudonodules), which indicates benignity.  Moreover, the dominant 2 nodules were biopsied and were benign.  No follow-up is needed for these.  She did have another, small, nodule on the ultrasound from 2019, which was stable in size, but did not have the pseudonodular appearance.  This was quite small and also isoechoic, therefore low risk, so no further intervention was required for this nodule.  We did discuss in the past that further ultrasounds were not needed, but she had another ultrasound ordered by PCP 11/2020.  This showed stable nodules,  without changes.  Since these findings were replicated over 5 years, no further ultrasounds are needed. -The latest thyroid ultrasound showed that the thyroid gland was slightly larger, and, since she started to experience pressure in her neck exacerbated by tight clothes, and felt that this pressure was increasing, I did recommend total thyroidectomy.  However, she wanted to become pregnant again and we discussed at last visit about postponing thyroidectomy until after she gives birth.  At last visit, however, after losing weight, the neck compression symptoms improved.  As of now, the neck pressure is intermittent and not very bothersome.  She finishes her masters degree next month and then he is contemplating a pregnancy within the next 1 to 2 years. -At this visit, we will recheck her TFTs, but we discussed that no intervention is needed for now -If her neck compression symptoms worsen, we will repeat a thyroid ultrasound, not necessarily to follow the nodules, but to follow the thyroid gland size  2.  Abnormal free thyroid hormones -pt. With a h/o elevated T3, and also slightly low free T4 during the pregnancy.  No intervention was needed for this.  This could have been related to pregnancy or to assay variability, but not necessarily a pathologic finding -Of note, previous thyroid antibodies were not elevated so we do not have evidence of Hashimoto's thyroiditis -Her TFTs were normal at last visit in 12/2020 and also 2x in 2021 -We will repeat her TFTs today -At last visit she lost 16 pounds intentionally.  At this visit, she gained 15 back.  No thyrotoxic signs or symptoms. -I will see her back in a year  Component     Latest Ref Rng 12/26/2021  TSH     0.35 - 5.50 uIU/mL 0.81   T4,Free(Direct)     0.60 - 1.60 ng/dL 4.74   Triiodothyronine,Free,Serum     2.3 - 4.2 pg/mL 4.2    TFTs are normal.  Carlus Pavlov, MD PhD [facility]

## 2021-12-27 ENCOUNTER — Telehealth: Payer: Self-pay

## 2021-12-27 NOTE — Telephone Encounter (Signed)
-----   Message from Philemon Kingdom, MD sent at 12/26/2021  5:19 PM EDT ----- Can you please call pt.:  Her TFTs are all normal.

## 2021-12-27 NOTE — Telephone Encounter (Signed)
Lvm for pt to call back. 

## 2022-10-21 ENCOUNTER — Ambulatory Visit (HOSPITAL_COMMUNITY)
Admission: EM | Admit: 2022-10-21 | Discharge: 2022-10-21 | Disposition: A | Discharge status: Home / Self Care | Payer: BC Managed Care – PPO

## 2022-10-21 DIAGNOSIS — F4320 Adjustment disorder, unspecified: Secondary | ICD-10-CM

## 2022-10-21 DIAGNOSIS — F411 Generalized anxiety disorder: Secondary | ICD-10-CM | POA: Diagnosis not present

## 2022-10-21 NOTE — Progress Notes (Signed)
   10/21/22 1758  [facility] Triage Screening (Walk-ins at [facility] only)  How Did You Hear About [place]? Primary Care  What Is the Reason for Your Visit/Call Today? Pt presents to [facility] unaccomapnied by anyone. Pt states that she went to the Dr because she was having tremors and tingling. Pt states that the Dr stated she was having a panic attack due to the symptoms that she shared with Dr. Rock Nephew. states that her Dr stated that her symptoms could be due to some stress and anxiety. Pt denies SI/HI and AVH at the current time. Pt denies any alcohol and substance abuse usage at this current moment.  How Long Has This Been Causing You Problems? <Week  Have You Recently Had Any Thoughts About Hurting Yourself? No  Are You Planning to Commit Suicide/Harm Yourself At This time? No  Have you Recently Had Thoughts About Hurting Someone Karolee Ohs? No  Are You Planning To Harm Someone At This Time? No  Are you currently experiencing any auditory, visual or other hallucinations? No  Have You Used Any Alcohol or Drugs in the Past 24 Hours? No  Do you have any current medical co-morbidities that require immediate attention? No  Clinician description of patient physical appearance/behavior: casually dressed, calm, and cooperative  What Do You Feel Would Help You the Most Today? Treatment for Depression or other mood problem  If access to [facility] was not available, would you have sought care in the Emergency Department? Yes  Determination of Need Routine (7 days)  Options For Referral Outpatient Therapy;Medication Management

## 2022-10-21 NOTE — ED Provider Notes (Signed)
Behavioral Health Urgent Care Medical Screening Exam  Patient Name: Meghan Jackson MRN: [id number] Date of Evaluation: 10/21/22 Chief Complaint:  Adjustment disorder Diagnosis:  Final diagnoses:  Generalized anxiety disorder  Adjustment disorder, unspecified type    History of Present illness: Meghan Jackson is a 42 y.o. female brought in by self at the request of the urgent care. For last 4 days patient has been vomiting, having chest pain with deep breaths, diarrhea and headache. Patient says she is experiencing anxiety intermittently.  During evaluation [name] is sitting in a chair in no acute distress.  She is alert, oriented x 4, calm, cooperative and attentive. Her mood is euthymic with congruent affect.  She has normal speech, and behavior.  Objectively there is no evidence of psychosis/mania or delusional thinking.  Patient is able to converse coherently, goal directed thoughts, no distractibility, or pre-occupation. She also denies suicidal/self-harm/homicidal ideation, psychosis, and paranoia.  Patient answered question appropriately.    At this time [name] is educated and verbalizes understanding of mental health resources and other crisis services in the community. She is instructed to call 911 and present to the nearest emergency room should she experience any suicidal/homicidal ideation, auditory/visual/hallucinations, or detrimental worsening of her mental health condition.  She was a also advised by Clinical research associate where to go to find outpatient medication providers including calling her insurance for a list of in network providers and utilizing psychology today.  Patient does not meet criteria for inpatient psychiatric hospitalization at this time.     Flowsheet Row ED from 10/21/2022 in [facility]  C-SSRS RISK CATEGORY No Risk       Psychiatric Specialty Exam  Presentation  General Appearance:Appropriate for Environment  Eye  Contact:Good  Speech:Clear and Coherent  Speech Volume:Normal  Handedness:Right   Mood and Affect  Mood: Euthymic  Affect: Congruent   Thought Process  Thought Processes: Coherent  Descriptions of Associations:Intact  Orientation:Full (Time, Place and Person)  Thought Content:WDL    Hallucinations:None  Ideas of Reference:None  Suicidal Thoughts:No  Homicidal Thoughts:No   Sensorium  Memory: Immediate Good; Recent Good; Remote Good  Judgment: Good  Insight: Good   Executive Functions  Concentration: Good  Attention Span: Good  Recall: Good  Fund of Knowledge: Good  Language: Good   Psychomotor Activity  Psychomotor Activity: Normal   Assets  Assets: Communication Skills; Desire for Improvement; Financial Resources/Insurance; Housing; Transportation; Vocational/Educational   Sleep  Sleep: Fair  Number of hours:  6   Physical Exam: Physical Exam Eyes:     Pupils: Pupils are equal, round, and reactive to light.  Neck:     Comments: Enlarged neck Pulmonary:     Effort: Pulmonary effort is normal.  Skin:    General: Skin is dry.  Neurological:     Mental Status: She is alert and oriented to person, place, and time.    Review of Systems  Musculoskeletal:  Positive for neck pain.  Psychiatric/Behavioral:  The patient is nervous/anxious.   All other systems reviewed and are negative.  Blood pressure 120/72, pulse 82, temperature 99 F (37.2 C), temperature source Oral, resp. rate 16, SpO2 100%, unknown if currently breastfeeding. There is no height or weight on file to calculate BMI.  Musculoskeletal: Strength & Muscle Tone: within normal limits Gait & Station: normal Patient leans: N/A   [facility] MSE Discharge Disposition for Follow up and Recommendations: Based on my evaluation the patient does not appear to have an  emergency medical condition and can be discharged with resources and follow up care in outpatient  services for Medication Management   Thomes Lolling, NP 10/21/2022, 6:14 PM

## 2022-10-21 NOTE — Discharge Instructions (Addendum)

## 2022-12-26 ENCOUNTER — Ambulatory Visit: Payer: BC Managed Care – PPO | Admitting: Internal Medicine

## 2022-12-26 ENCOUNTER — Encounter: Payer: Self-pay | Admitting: Internal Medicine

## 2022-12-26 VITALS — BP 110/60 | HR 79 | Ht 64.0 in | Wt 162.4 lb

## 2022-12-26 DIAGNOSIS — E042 Nontoxic multinodular goiter: Secondary | ICD-10-CM | POA: Diagnosis not present

## 2022-12-26 DIAGNOSIS — E0590 Thyrotoxicosis, unspecified without thyrotoxic crisis or storm: Secondary | ICD-10-CM

## 2022-12-26 NOTE — Patient Instructions (Signed)
Please stop at the lab.  Please come back for a follow-up appointment in 1 year.

## 2022-12-26 NOTE — Progress Notes (Unsigned)
Patient ID: Meghan Jackson, female   DOB: Aug 11, 1980, 42 y.o.   MRN: [id number]   HPI  Meghan Jackson is a 42 y.o.-year-old female, presenting for follow-up for thyroid nodules and also for history of elevated T3 and low free T4.  Last visit 1 year ago.  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  She already finished her masters degree. She has some anxiety - has anxiety meds - not really taking them.  Reviewed history: Patient has a history of 2 miscarriages and also having infertility treatments with Dr. Juliene Jackson.  During investigation for the above problems, TSH was found to be normal but her T3 was elevated and she was referred back to endocrinology at that time.  In the months prior to our last appointment she had a normal TSH and a slightly low free T4.  The free T4 remains slightly low in 12/2018.  She also has a history of thyroid nodules that were discovered incidentally during investigation for infertility. At that time, on palpation, the thyroid was felt to be enlarged.  The thyroid ultrasound showed 2 dominant nodules, which were biopsied with benign results.    She had another thyroid ultrasound in 2019 that showed that the 2 nodules could have been areas of inflammation (pseudonodules).  Reviewed her imaging test report and biopsy results: Thyroid U/S (01/05/2016): Isthmic 1.1 cm isoechoic nodule Right mid 2.8 x 1.3 x 2.1 cm solid isoechoic nodule; few other small scattered cystic areas including a 0.6 cm predominately cystic nodule in the superior right lobe and a 0.5 cm partially cystic nodule in the inferior right lobe which are clearly low suspicion. Left superior 3.1 x 1.9 x 3 cm solid, isoechoic, nodule  FNA of both dominant nodules (01/21/2016): Benign  Thyroid U/S (04/19/2017): Parenchymal Echotexture: Markedly heterogenous Isthmus: Enlarged measuring 0.6 cm in diameter, unchanged Right lobe: Enlarged measuring 7.2 x 3.2 x  3.8 cm, unchanged, previously, 6.9 x 3.3 x 3.9 cm Left lobe: Enlarged measuring 8.0 x 2.9 x 3.6 cm, unchanged, previously, 6.8 x 3.0 x 3.7 cm __________________________________________  Nodule # 1: Prior biopsy: No Location: Isthmus; Mid Maximum size: 1.1 cm; Other 2 dimensions: 1.1 x 0.9 cm, previously, 1.1 x 1.1 x 0.9 cm Composition: solid/almost completely solid (2) Echogenicity: isoechoic (1)  Given size (<1.4 cm) and appearance, this nodule does NOT meet TI-RADS criteria for biopsy or dedicated follow-up. _________________________________________________________  There is a punctate (approximately 0.6 cm) hypoechoic nodule within the superior pole the right lobe of the thyroid which does not meet imaging criteria to recommend percutaneous sampling or dedicated follow-up  Previously biopsied ill-defined approximately 2.8 cm apparent nodule within the mid aspect of the right lobe of the thyroid is less conspicuous on the present examination and thus is favored to have represented a pseudo nodule. Correlation with prior biopsy results is recommended.  Previously biopsied 3.1 cm nodule/mass within the superior pole of the left lobe of the thyroid is less conspicuous on the present examination and thus favored to have represented a pseudo nodule. Correlation prior biopsy results is recommended.  IMPRESSION: 1. Similar findings of multinodular goiter. No definitive new or enlarging thyroid nodules. 2. Previously biopsied bilateral thyroid nodules are less conspicuous on the present examination and may have represented pseudo nodules. Correlation with prior biopsy results is recommended. Assuming benign pathologic diagnosis, repeat sampling and/or continued dedicated follow-up is not recommended.  Thyroid U/S (11/12/2020): Stable nodules, but thyroid gland was  enlarged Parenchymal Echotexture: Mildly heterogenous Isthmus: Enlarged measuring 1.2 cm in diameter  Right lobe:  Enlarged measuring 7.7 x 3.0 x 4.5 cm, previously, 7.2 x 3.2 x 3.8 cm  Left lobe: Enlarged measuring 8.8 x 3.9 x 4.6 cm, previously, 8.0 x 2.9 x 3.6 cm  _________________________________________________________   Previously questioned 1.1 cm isoechoic ill-defined nodule within the thyroid isthmus is not definitely seen on the present examination and thus favored to have represented a pseudonodule.  _________________________________________________________   The previously biopsied approximately 2.6 x 2.5 x 1.9 cm partially cystic though predominantly solid isoechoic ill-defined nodule within the mid aspect the right lobe of the thyroid (labeled 1), is grossly unchanged compared to the 12/2015 examination, previously, 2.8 cm in diameter. Correlation with previous biopsy results is advised.  _________________________________________________________   Previously biopsied approximately 4.1 x 2.9 x 2.3 cm partially cystic though predominantly solid isoechoic ill-defined nodule within the mid aspect the left lobe of the thyroid (labeled 2) has minimally increased in size compared to the 12/2015 examination, previously, 3.2 cm, with slight size differences likely attributable to a combination of interval partial cystic degeneration (a typically benign finding) as well as accentuated by the ill-defined borders of the nodule. Correlation with previous biopsy results is advised.   IMPRESSION: 1. Similar findings of thyromegaly and multinodular goiter. No worrisome new or enlarging thyroid nodules. 2. Previously biopsied bilateral thyroid nodules appear grossly unchanged compared to the 12/2015 examination. Correlation with previous biopsy results is advised. Additionally, relative imaging stability for the past 5 years is indicative of a benign etiology.  Pt denies: - hoarseness - dysphagia - choking - SOB with lying down She continues to have neck pressure, but not very bothersome.   This is mostly when she wears clothes too close to her neck.  Reviewed her TFTs: Lab Results  Component Value Date   TSH 0.81 12/26/2021   TSH 0.69 12/22/2020   TSH 0.63 12/23/2019   TSH 0.65 03/27/2019   TSH 0.67 09/19/2018   TSH 1.00 04/05/2017   TSH 0.401 07/07/2013   FREET4 0.75 12/26/2021   FREET4 0.65 12/22/2020   FREET4 0.64 12/23/2019   FREET4 0.86 03/27/2019   FREET4 0.55 (L) 09/19/2018   FREET4 0.81 04/05/2017   Lab Results  Component Value Date   T3FREE 4.2 12/26/2021   T3FREE 3.3 12/22/2020   T3FREE 3.9 12/23/2019   T3FREE 3.6 03/27/2019   T3FREE 3.6 09/19/2018   T3FREE 4.1 04/05/2017  12/26/2018: TSH 1.24, Free T4 0.70 (0.82-1.77) 08/29/2018: TSH 0.57 12/25/2015: TSH 0.794, total T4 6.7  Her thyroid antibodies were not elevated: Component     Latest Ref Rng & Units 09/19/2018  Thyroperoxidase Ab SerPl-aCnc     <9 IU/mL 1  Thyroglobulin Ab     < or = 1 IU/mL <1  TSI     <140 % baseline <89  12/25/2015: TPO antibodies 15 (0-34)  No FH of thyroid disease or thyroid cancer. No h/o radiation tx to head or neck. No herbal supplements. No Biotin use. No recent steroids use.   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  ROS: + See HPI  I reviewed pt's medications, allergies, PMH, social hx, family hx, and changes were documented in the history of present illness. Otherwise, unchanged from my initial visit note.  Past Medical History:  Diagnosis Date   Anal fissure    Hemorrhoids    Irregular heart rhythm    Past Surgical History:  Procedure Laterality Date  NO PAST SURGERIES     Social History   Social History   Marital status: Married     Spouse name: N/A   Number of children: 0   Occupational History   Internal trainer   Social History Main Topics   Smoking status: Never Smoker   Smokeless tobacco: Never Used   Alcohol use     1 Glasses of wine Every 2 months      Comment: occ   Drug use: No   Sexual activity: Yes   No  current outpatient medications on file prior to visit.   No current facility-administered medications on file prior to visit.   Allergies  Allergen Reactions   Latex Itching and Other (See Comments)    dryness   Family History  Problem Relation Age of Onset   Kidney disease Father    Cancer Maternal Grandmother    PE: There were no vitals taken for this visit. Wt Readings from Last 3 Encounters:  12/26/21 174 lb (78.9 kg)  12/22/20 159 lb 9.6 oz (72.4 kg)  12/23/19 175 lb 9.6 oz (79.7 kg)   Constitutional: overweight, in NAD Eyes: EOMI, no exophthalmos ENT: + very large goiter, symmetric, no cervical lymphadenopathy Cardiovascular: RRR, No MRG Respiratory: CTA B Musculoskeletal: no deformities Skin: moist, warm, no rashes Neurological: no tremor with outstretched hands  ASSESSMENT: 1. Multiple thyroid nodules  2. Abnormal free thyroid hormones   PLAN: 1. Multiple thyroid nodules -Patient with a large thyroid syndrome with ultrasound.  Dominant nodules were not larger on the last ultrasound from 11/12/2020.  They were stable and isoechoic, without microcalcifications, and, importantly, appearing more like pseudonodules the regular nodules, which are actually slightly inflammation.  -She continues to have neck compression symptoms and she feels that the pressure in her neck is exacerbated by tight clothes.  It was intermittent and not very bothersome.  Moreover, she noticed that the pressure in her neck was improved by 6 pound weight loss and then worsened after she gained some of the weight back.  At last visit, we discussed about the possibility of thyroidectomy, but we did not proceed with this that she was preparing for a pregnancy.  We decided to wait until after the pregnancy and also after she finished her masters degree.  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  In that case, we discussed about repeating a thyroid ultrasound now and I  would be more prone to suggest thyroidectomy if the gland size is larger.  She is reticent to go through with this, however, due to the need for levothyroxine for the rest of her life. -Will also recheck her TFTs today  2.  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 With a h/o elevated T3, and also slightly low free T4 during the pregnancy.  No intervention was needed for this.  This could have been related to pregnancy or to assay variability, but not necessarily a pathologic finding -Previous antithyroid antibody levels were not elevated to point towards a diagnosis of Hashimoto's thyroiditis -TFTs were all normal at last visit in 12/2021 -Will repeat her TFTs today -She has no thyrotoxic signs or symptoms -I will see her back in a year  Carlus Pavlov, MD PhD [facility] Endocrinology

## 2022-12-27 LAB — TSH: TSH: 0.62 u[IU]/mL (ref 0.35–5.50)

## 2022-12-27 LAB — T3, FREE: T3, Free: 3.2 pg/mL (ref 2.3–4.2)

## 2022-12-27 LAB — T4, FREE: Free T4: 0.66 ng/dL (ref 0.60–1.60)

## 2023-09-13 ENCOUNTER — Ambulatory Visit
Admission: RE | Admit: 2023-09-13 | Discharge: 2023-09-13 | Disposition: A | Discharge status: Home / Self Care | Source: Ambulatory Visit | Attending: Internal Medicine | Admitting: Internal Medicine

## 2023-09-13 DIAGNOSIS — E042 Nontoxic multinodular goiter: Secondary | ICD-10-CM

## 2023-09-17 ENCOUNTER — Telehealth: Payer: Self-pay | Admitting: Internal Medicine

## 2023-09-17 ENCOUNTER — Encounter: Payer: Self-pay | Admitting: Internal Medicine

## 2023-09-17 ENCOUNTER — Ambulatory Visit: Payer: Self-pay | Admitting: Internal Medicine

## 2023-09-17 NOTE — Telephone Encounter (Signed)
 Patient called asking if provider has went over US  results.Also pt is wanting to discuss a possible procedure and would like the provider's opinion.Please advise,her contact info is [phone]

## 2023-09-21 ENCOUNTER — Encounter: Payer: Self-pay | Admitting: Internal Medicine

## 2023-09-21 ENCOUNTER — Telehealth (INDEPENDENT_AMBULATORY_CARE_PROVIDER_SITE_OTHER): Admitting: Internal Medicine

## 2023-09-21 DIAGNOSIS — E0590 Thyrotoxicosis, unspecified without thyrotoxic crisis or storm: Secondary | ICD-10-CM

## 2023-09-21 DIAGNOSIS — E042 Nontoxic multinodular goiter: Secondary | ICD-10-CM | POA: Diagnosis not present

## 2023-09-21 NOTE — Progress Notes (Signed)
 Patient ID: Meghan Jackson, female   DOB: [birth year], 43 y.o.   MRN: [id number]   Patient location: Home My location: Office Persons participating in the virtual visit: patient, provider  Referring Provider: Pa, [facility]  I connected with the patient on 09/21/23 at  9:40 AM EDT by a video enabled telemedicine application and verified that I am speaking with the correct person.   I discussed the limitations of evaluation and management by telemedicine and the availability of in person appointments. The patient expressed understanding and agreed to proceed.   Details of the encounter are shown below.  HPI  Meghan Jackson is a 43 y.o.-year-old female, presenting for follow-up for thyroid  nodules and also for history of elevated T3 and low free T4.  Last visi 9 months ago.  Interim history: Patient mentions that for the last 2 months she has had more neck pressure. 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  She already finished her masters degree.  Reviewed history: Patient has a history of 2 miscarriages and also having infertility treatments with Dr. Barbette.  During investigation for the above problems, TSH was found to be normal but her T3 was elevated and she was referred back to endocrinology at that time.  In the months prior to our last appointment she had a normal TSH and a slightly low free T4.  The free T4 remains slightly low in 12/2018.  She also has a history of thyroid  nodules that were discovered incidentally during investigation for infertility. At that time, on palpation, the thyroid  was felt to be enlarged.  The thyroid  ultrasound showed 2 dominant nodules, which were biopsied with benign results.    She had another thyroid  ultrasound in 2019 that showed that the 2 nodules could have been areas of inflammation (pseudonodules).  Reviewed and addended her imaging test report and biopsy results: Thyroid  U/S  (01/05/2016): Isthmic 1.1 cm isoechoic nodule Right mid 2.8 x 1.3 x 2.1 cm solid isoechoic nodule; few other small scattered cystic areas including a 0.6 cm predominately cystic nodule in the superior right lobe and a 0.5 cm partially cystic nodule in the inferior right lobe which are clearly low suspicion. Left superior 3.1 x 1.9 x 3 cm solid, isoechoic, nodule  FNA of both dominant nodules (01/21/2016): Benign  Thyroid  U/S (04/19/2017): Parenchymal Echotexture: Markedly heterogenous Isthmus: Enlarged measuring 0.6 cm in diameter, unchanged Right lobe: Enlarged measuring 7.2 x 3.2 x 3.8 cm, unchanged, previously, 6.9 x 3.3 x 3.9 cm Left lobe: Enlarged measuring 8.0 x 2.9 x 3.6 cm, unchanged, previously, 6.8 x 3.0 x 3.7 cm __________________________________________  Nodule # 1: Prior biopsy: No Location: Isthmus; Mid Maximum size: 1.1 cm; Other 2 dimensions: 1.1 x 0.9 cm, previously, 1.1 x 1.1 x 0.9 cm Composition: solid/almost completely solid (2) Echogenicity: isoechoic (1)  Given size (<1.4 cm) and appearance, this nodule does NOT meet TI-RADS criteria for biopsy or dedicated follow-up. _________________________________________________________  There is a punctate (approximately 0.6 cm) hypoechoic nodule within the superior pole the right lobe of the thyroid  which does not meet imaging criteria to recommend percutaneous sampling or dedicated follow-up  Previously biopsied ill-defined approximately 2.8 cm apparent nodule within the mid aspect of the right lobe of the thyroid  is less conspicuous on the present examination and thus is favored to have represented a pseudo nodule. Correlation with prior biopsy results is recommended.  Previously biopsied 3.1 cm nodule/mass within the superior pole of the left  lobe of the thyroid  is less conspicuous on the present examination and thus favored to have represented a pseudo nodule. Correlation prior biopsy results is  recommended.  IMPRESSION: 1. Similar findings of multinodular goiter. No definitive new or enlarging thyroid  nodules. 2. Previously biopsied bilateral thyroid  nodules are less conspicuous on the present examination and may have represented pseudo nodules. Correlation with prior biopsy results is recommended. Assuming benign pathologic diagnosis, repeat sampling and/or continued dedicated follow-up is not recommended.  Thyroid  U/S (11/12/2020): Stable nodules, but thyroid  gland was enlarged Parenchymal Echotexture: Mildly heterogenous Isthmus: Enlarged measuring 1.2 cm in diameter  Right lobe: Enlarged measuring 7.7 x 3.0 x 4.5 cm, previously, 7.2 x 3.2 x 3.8 cm  Left lobe: Enlarged measuring 8.8 x 3.9 x 4.6 cm, previously, 8.0 x 2.9 x 3.6 cm  _________________________________________________________   Previously questioned 1.1 cm isoechoic ill-defined nodule within the thyroid  isthmus is not definitely seen on the present examination and thus favored to have represented a pseudonodule.  _________________________________________________________   The previously biopsied approximately 2.6 x 2.5 x 1.9 cm partially cystic though predominantly solid isoechoic ill-defined nodule within the mid aspect the right lobe of the thyroid  (labeled 1), is grossly unchanged compared to the 12/2015 examination, previously, 2.8 cm in diameter. Correlation with previous biopsy results is advised.  _________________________________________________________   Previously biopsied approximately 4.1 x 2.9 x 2.3 cm partially cystic though predominantly solid isoechoic ill-defined nodule within the mid aspect the left lobe of the thyroid  (labeled 2) has minimally increased in size compared to the 12/2015 examination, previously, 3.2 cm, with slight size differences likely attributable to a combination of interval partial cystic degeneration (a typically benign finding) as well as accentuated by the  ill-defined borders of the nodule. Correlation with previous biopsy results is advised.   IMPRESSION: 1. Similar findings of thyromegaly and multinodular goiter. No worrisome new or enlarging thyroid  nodules. 2. Previously biopsied bilateral thyroid  nodules appear grossly unchanged compared to the 12/2015 examination. Correlation with previous biopsy results is advised. Additionally, relative imaging stability for the past 5 years is indicative of a benign etiology.  Thyroid  U/S (09/13/2023): Parenchymal Echotexture: Moderately heterogeneous  Isthmus: 1.5 cm ,previously 1.2 cm  Right lobe: 11.8 x 4.4 x 4.4 cm ,previously 7.7 x 3.0 x 4.5 cm  Left lobe: 11.8 x 5.2 x 6.1 cm ,previously 8.8 x 3.9 x 4.6 cm  ________________________________________________________   Estimated total number of nodules >/= 1 cm: 2 ________________________________________________________   Similar benign appearing appearing solid cystic nodule right mid thyroid  measuring up to 3.8 cm. _________________________________________________________   Similar appearance of previously biopsied solid cystic nodule in the right inferior thyroid  (labeled 2, 3.0 cm, previously 2.6 cm).   No cervical lymphadenopathy.   IMPRESSION: Similar appearing multinodular goiter.  Pt denies: - hoarseness - dysphagia - choking - SOB with lying down She continues to have neck pressure, not extremely bothersome previously but more bothersome in the last 2 months.  Reviewed her TFTs: Lab Results  Component Value Date   TSH 0.62 12/26/2022   TSH 0.81 12/26/2021   TSH 0.69 12/22/2020   TSH 0.63 12/23/2019   TSH 0.65 03/27/2019   TSH 0.67 09/19/2018   TSH 1.00 04/05/2017   TSH 0.401 07/07/2013   FREET4 0.66 12/26/2022   FREET4 0.75 12/26/2021   FREET4 0.65 12/22/2020   FREET4 0.64 12/23/2019   FREET4 0.86 03/27/2019   FREET4 0.55 (L) 09/19/2018   FREET4 0.81 04/05/2017   Lab Results  Component Value Date  T3FREE  3.2 12/26/2022   T3FREE 4.2 12/26/2021   T3FREE 3.3 12/22/2020   T3FREE 3.9 12/23/2019   T3FREE 3.6 03/27/2019   T3FREE 3.6 09/19/2018   T3FREE 4.1 04/05/2017  12/26/2018: TSH 1.24, Free T4 0.70 (0.82-1.77) 08/29/2018: TSH 0.57 12/25/2015: TSH 0.794, total T4 6.7  Her thyroid  antibodies were not elevated: Component     Latest Ref Rng & Units 09/19/2018  Thyroperoxidase Ab SerPl-aCnc     <9 IU/mL 1  Thyroglobulin Ab     < or = 1 IU/mL <1  TSI     <140 % baseline <89  12/25/2015: TPO antibodies 15 (0-34)  No FH of thyroid  disease or thyroid  cancer. No h/o radiation tx to head or neck. No herbal supplements. No Biotin use. No recent steroids use.   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  ROS: + See HPI  I reviewed pt's medications, allergies, PMH, social hx, family hx, and changes were documented in the history of present illness. Otherwise, unchanged from my initial visit note.  Past Medical History:  Diagnosis Date   Anal fissure    Hemorrhoids    Irregular heart rhythm    Past Surgical History:  Procedure Laterality Date   NO PAST SURGERIES     Social History   Social History   Marital status: Married     Spouse name: N/A   Number of children: 0   Occupational History   Internal trainer   Social History Main Topics   Smoking status: Never Smoker   Smokeless tobacco: Never Used   Alcohol use     1 Glasses of wine Every 2 months      Comment: occ   Drug use: No   Sexual activity: Yes   No current outpatient medications on file prior to visit.   No current facility-administered medications on file prior to visit.   Allergies  Allergen Reactions   Latex Itching and Other (See Comments)    dryness   Family History  Problem Relation Age of Onset   Kidney disease Father    Cancer Maternal Grandmother    PE: There were no vitals taken for this visit. Wt Readings from Last 3 Encounters:  12/26/22 162 lb 6.4 oz (73.7 kg)  12/26/21 174 lb  (78.9 kg)  12/22/20 159 lb 9.6 oz (72.4 kg)   Constitutional: overweight, in NAD ENT: + very large goiter, symmetric, easily visible  ASSESSMENT: 1.  Large multinodular goiter  2. Abnormal free thyroid  hormones   PLAN: 1.  Large multinodular goiter -Patient with a history of a large goiter with several thyroid  nodules on ultrasound.  The dominant nodules were not larger on the ultrasound from 11/2020.  They were stable and isoechoic, without microcalcifications, and importantly, appearing more like pseudo nodules the irregular nodules, which point towards size of inflammation -We repeated the thyroid  ultrasound on 09/13/2023 and this showed stable appearance of her nodules, but the thyroid  size actually increased bilaterally from 7.7 cm (right lobe) and 8.8 cm (left lobe) to 11.8 cm for both lobes.  Of note, only 2 nodules were more clearly discerned, measuring 3.8 and 3.0 cm respectively. -Patient continues to have neck compression symptoms and also neck pressure, especially in the last 2 months.  At last visit, this was intermittent and not very bothersome and she noticed that her neck pressure improved after losing 6 pounds but then worsened after she gained some of the weight back.  We previously discussed about possibility  of thyroidectomy but she wanted to wait as she was preparing for pregnancy.  Also, she wanted to finish her masters degree.  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  She mentions she would want pregnancy afterwards. - After the results of the thyroid  ultrasound returned, she contacted me about the possibility of radiofrequency ablation of her nodules.  We scheduled this appointment to discuss more about it. - At today's visit, we reviewed what the procedure means -minimally invasive procedure using heat energy to destroy parts of the thyroid  nodules with the scope of shrinking down over time.  Usually this is employed in patients  with 1 clearly defined nodule that was previously biopsied with benign results.  Sometimes, this can be employed in patients with several nodules, if they are clearly defined.  This is done under local anesthesia with the help of an stained probe guided by ultrasound.  There is pressure and there could be some pain at the site of the procedure.  The procedure last between 30 to 60 minutes and is done in outpatient.  There may be light soreness for 1 to 2 days, but it is possible that she may have some swelling for up to 2 weeks in her neck region, also, patient's may have some hoarseness or skin bruising.  Most nodules will shrink by 40 to 80% within 6 months to a year. -In her case, however, I do not feel that she is a great candidate for this procedure as her nodules are only a small part of her goiter and they are not clearly defined, organized more as pseudo nodules other than clear nodules.  Also, her goiter is quite large and less likely to be amenable for the procedure.  Therefore, I still suggested thyroidectomy for her as a definitive treatment.  Since she is not quite decided for this yet, I suggested a referral to surgery (Dr. Eletha) to find out more details about the procedure.  We did discuss about it in general terms.  She agrees with this plan.  We discussed about further management with levothyroxine after surgery and the fact that she will need to take this every day for the rest of her life.  She is not excited about taking the medication every day from now on, but accepts to do so if absolutely necessary. - I will see her back in 3 mo  2.  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  However, no intervention was needed for this as I explained that this could have been related to pregnancy and/or as a variability, but not necessarily a pathologic finding - Previous antithyroid antibodies  were not elevated to points towards a diagnosis of Hashimoto's thyroiditis - at today's visit, she does not report any thyrotoxic signs or symptoms - TFTs were all normal in 12/2022, at last visit - Will repeat her TFTs at next visit  Referral Orders         Ambulatory referral to General Surgery      Lela Fendt, MD PhD [facility]

## 2023-09-21 NOTE — Patient Instructions (Addendum)
 I will refer you to Dr. Eletha.  After surgery, we will need Levothyroxine every day, with water, at least 30 minutes before breakfast, separated by at least 4 hours from: - acid reflux medications - calcium - iron - multivitamins  Please come back in 3 months.

## 2023-10-10 ENCOUNTER — Other Ambulatory Visit: Payer: Self-pay | Admitting: Surgery

## 2023-10-10 DIAGNOSIS — E042 Nontoxic multinodular goiter: Secondary | ICD-10-CM

## 2023-10-16 ENCOUNTER — Ambulatory Visit
Admission: RE | Admit: 2023-10-16 | Discharge: 2023-10-16 | Disposition: A | Discharge status: Home / Self Care | Source: Ambulatory Visit | Attending: Surgery | Admitting: Surgery

## 2023-10-16 DIAGNOSIS — E042 Nontoxic multinodular goiter: Secondary | ICD-10-CM

## 2023-10-16 MED ORDER — IOPAMIDOL (ISOVUE-370) INJECTION 76%
75.0000 mL | Freq: Once | INTRAVENOUS | Status: AC | PRN
  Administered 2023-10-16: 75 mL via INTRAVENOUS

## 2023-10-26 ENCOUNTER — Ambulatory Visit: Payer: Self-pay | Admitting: Surgery

## 2023-10-26 NOTE — Progress Notes (Signed)
 Telephone call to patient to discuss CT scan findings.  No involvement of mediastinum or chest.  Will plan to proceed with total thyroidectomy through a cervical approach as we discussed in the office.  Will send orders to schedulers to contact the patient.  Krystal Spinner, MD [facility] practice Office: [phone]

## 2023-10-26 NOTE — H&P (Signed)
 REFERRING PHYSICIAN: Trixie File, MD  PROVIDER: Haidy Kackley OZELL SPINNER, MD   Chief Complaint: New Consultation (Multinodular goiter)  History of Present Illness:  Patient is referred by Dr. File Trixie for surgical evaluation and management of an enlarging multinodular thyroid  goiter with early compressive symptoms. Patient was first diagnosed with multinodular goiter approximately 8 years ago. She has been followed with sequential ultrasound scanning. She has undergone previous fine-needle aspiration biopsies with benign cytopathology. Thyroid  function has remained remarkably stable with a TSH recently of 0.62. Patient has never been on thyroid  medication. Patient denies any previous head or neck surgery. There is no family history of thyroid  disease and specifically no history of thyroid  cancer. Patient underwent a recent ultrasound and early July 2025. This shows interval enlargement of her thyroid  gland with the right lobe measuring 11.8 cm and left lobe measuring 11.8 cm. No new or worrisome features were identified. Patient now presents to discuss thyroidectomy for management of multinodular goiter with continued increase in size and the development of mild early compressive symptoms. 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  Review of Systems: A complete review of systems was obtained from the patient. I have reviewed this information and discussed as appropriate with the patient. See HPI as well for other ROS.  Review of Systems  Constitutional: Negative.  HENT:  Mild globus type sensation  Eyes: Negative.  Respiratory: Negative.  Cardiovascular: Negative.  Gastrointestinal: Negative.  Genitourinary: Negative.  Musculoskeletal: Negative.  Skin: Negative.  Neurological: Negative.  Endo/Heme/Allergies: Negative.  Psychiatric/Behavioral: Negative.    Medical History: History reviewed. No pertinent past medical history.  Patient Active Problem List   Diagnosis  Goiter, nontoxic, multinodular   Past Surgical History:  Procedure Laterality Date  wisdom teeth removal 2021  polyps removed 2022    Allergies  Allergen Reactions  Latex Itching, Other (See Comments), Rash and Swelling  dryness   No current outpatient medications on file prior to visit.   No current facility-administered medications on file prior to visit.   History reviewed. No pertinent family history.   Social History   Tobacco Use  Smoking Status Never  Smokeless Tobacco Never    Social History   Socioeconomic History  Marital status: Married  Tobacco Use  Smoking status: Never  Smokeless tobacco: Never  Vaping Use  Vaping status: Unknown  Substance and Sexual Activity  Alcohol use: Never  Drug use: Never   Social Drivers of Corporate investment banker Strain: Low Risk (01/30/2019)  Received from [facility]  Overall Financial Resource Strain (CARDIA)  Difficulty of Paying Living Expenses: Not hard at all  Food Insecurity: No Food Insecurity (01/30/2019)  Received from [facility]  Hunger Vital Sign  Within the past 12 months, you worried that your food would run out before you got the money to buy more.: Never true  Within the past 12 months, the food you bought just didn't last and you didn't have money to get more.: Never true  Transportation Needs: No Transportation Needs (01/30/2019)  Received from [facility] - Transportation  Lack of Transportation (Medical): No  Lack of Transportation (Non-Medical): No  Physical Activity: Inactive (01/30/2019)  Received from [facility]  Exercise Vital Sign  On average, how many days per week do you engage in moderate to strenuous exercise (like a brisk walk)?: 0 days  On average, how many minutes do you engage in exercise at this level?: 0 min  Stress: No Stress Concern  Present (01/30/2019)  Received from [facility] - Occupational Stress  Questionnaire  Feeling of Stress : Not at all  Social Connections: Moderately Integrated (01/30/2019)  [facility]  Social Connection and Isolation Panel  In a typical week, how many times do you talk on the phone with family, friends, or neighbors?: More than three times a week  How often do you get together with friends or relatives?: More than three times a week  How often do you attend church or religious services?: More than 4 times per year  Do you belong to any clubs or organizations such as church groups, unions, fraternal or athletic groups, or school groups?: No  How often do you attend meetings of the clubs or organizations you belong to?: Never  Are you married, widowed, divorced, separated, never married, or living with a partner?: Married  Housing Stability: Unknown (10/09/2023)  Housing Stability Vital Sign  Homeless in the Last Year: No   Objective:   Vitals:  BP: 113/72  Pulse: (!) 114  Temp: 36.8 C (98.3 F)  SpO2: 99%  Weight: 70.4 kg (155 lb 3.2 oz)  Height: 162.6 cm (5' 4)  PainSc: 0-No pain   Body mass index is 26.64 kg/m.  Physical Exam   GENERAL APPEARANCE Comfortable, no acute issues Development: normal Gross deformities: none  SKIN Rash, lesions, ulcers: none Induration, erythema: none Nodules: none palpable  EYES Conjunctiva and lids: normal Pupils: equal  EARS, NOSE, MOUTH, THROAT External ears: no lesion or deformity External nose: no lesion or deformity Hearing: grossly normal  NECK Symmetric: no Trachea: Difficult to ascertain Thyroid : Markedly enlarged bilaterally, asymmetric, multilobulated, with multiple small and large nodules present. There is no tenderness. There is no associated lymphadenopathy. The gland extends down to the clavicles bilaterally. Full extent of the gland is difficult to ascertain on palpation.  CHEST/CV Not assessed  ABDOMEN Not assessed  GENITOURINARY/RECTAL Not  assessed  MUSCULOSKELETAL Station and gait: normal Digits and nails: no clubbing or cyanosis Muscle strength: grossly normal all extremities Deformity: none  LYMPHATIC Cervical: none palpable Supraclavicular: none palpable  PSYCHIATRIC Oriented to person, place, and time: yes Mood and affect: normal for situation Judgment and insight: appropriate for situation   Assessment and Plan:   Goiter, nontoxic, multinodular  Patient is referred by her endocrinologist, Dr. Lela Fendt, for surgical evaluation and recommendations regarding management of an enlarging multinodular thyroid  goiter with mild early compressive symptoms.  Patient provided with a copy of The Thyroid  Book: Medical and Surgical Treatment of Thyroid  Problems, published by [employer], 16 pages. Book reviewed and explained to patient during visit today.  Today we reviewed her clinical history. We reviewed her previous imaging studies. We reviewed the results of her cytopathology. We discussed options for management. At this point, total thyroidectomy would be the procedure of choice for management of a multinodular goiter of the size. We discussed the procedure at length. We discussed the size and location of the surgical incision. We discussed the risk and benefits of surgery including the risk of recurrent laryngeal nerve injury (approximately 5%) and the risk of injury to parathyroid glands. We discussed the hospital stay to be anticipated. We discussed her postoperative recovery and returned to work and activities. We discussed the need for lifelong thyroid  hormone replacement therapy. The patient understands and wishes to consider proceeding with surgery in the near future.  I would like to obtain a CT scan of the neck and chest  prior to making a final decision regarding thyroidectomy. It is difficult to tell from ultrasound examination and from physical examination the full extent of this thyroid  goiter. It is also  difficult to know the status of her airway. We will obtain a CT scan of the neck and chest with contrast. After I have reviewed those images, I will contact the patient and we will make a final decision regarding proceeding with surgery.  Krystal Spinner, MD [facility] practice Office: [phone]

## 2023-11-29 NOTE — Patient Instructions (Signed)
 SURGICAL WAITING ROOM VISITATION  Patients having surgery or a procedure may have no more than 2 support people in the waiting area - these visitors may rotate.    Children under the age of 75 must have an adult with them who is not the patient.  Visitors with respiratory illnesses are discouraged from visiting and should remain at home.  If the patient needs to stay at the hospital during part of their recovery, the visitor guidelines for inpatient rooms apply. Pre-op nurse will coordinate an appropriate time for 1 support person to accompany patient in pre-op.  This support person may not rotate.    Please refer to the [facility] website for the visitor guidelines for Inpatients (after your surgery is over and you are in a regular room).       Your procedure is scheduled on: 12/03/23   Report to [facility] Main Entrance    Report to admitting at 7:45 AM   Call this number if you have problems the morning of surgery [phone]   Do not eat food :After Midnight.   After Midnight you may have the following liquids until 7:45 AM DAY OF SURGERY  Water Non-Citrus Juices (without pulp, NO RED-Apple, White grape, White cranberry) Black Coffee (NO MILK/CREAM OR CREAMERS, sugar ok)  Clear Tea (NO MILK/CREAM OR CREAMERS, sugar ok) regular and decaf                             Plain Jell-O (NO RED)                                           Fruit ices (not with fruit pulp, NO RED)                                     Popsicles (NO RED)                                                               Sports drinks like Gatorade (NO RED)                      Oral Hygiene is also important to reduce your risk of infection.                                    Remember - BRUSH YOUR TEETH THE MORNING OF SURGERY WITH YOUR REGULAR TOOTHPASTE   Stop all vitamins and herbal supplements 7 days before surgery.   Take these medicines the morning of surgery with A SIP OF WATER: can use  nasal spray             You may not have any metal on your body including hair pins, jewelry, and body piercing             Do not wear make-up, lotions, powders, perfumes/cologne, or deodorant  Do not wear nail polish including gel and S&S, artificial/acrylic nails, or any other type of  covering on natural nails including finger and toenails. If you have artificial nails, gel coating, etc. that needs to be removed by a nail salon please have this removed prior to surgery or surgery may need to be canceled/ delayed if the surgeon/ anesthesia feels like they are unable to be safely monitored.   Do not shave  48 hours prior to surgery.    Do not bring valuables to the hospital. [facility] IS NOT             RESPONSIBLE   FOR VALUABLES.   Contacts, glasses, dentures or bridgework may not be worn into surgery.   Bring small overnight bag day of surgery.   DO NOT BRING YOUR HOME MEDICATIONS TO THE HOSPITAL. PHARMACY WILL DISPENSE MEDICATIONS LISTED ON YOUR MEDICATION LIST TO YOU DURING YOUR ADMISSION IN THE HOSPITAL!    Patients discharged on the day of surgery will not be allowed to drive home.  Someone NEEDS to stay with you for the first 24 hours after anesthesia.   Special Instructions: Bring a copy of your healthcare power of attorney and living will documents the day of surgery if you haven't scanned them before.              Please read over the following fact sheets you were given: IF YOU HAVE QUESTIONS ABOUT YOUR PRE-OP INSTRUCTIONS PLEASE CALL [phone] [place]   If you received a COVID test during your pre-op visit  it is requested that you wear a mask when out in public, stay away from anyone that may not be feeling well and notify your surgeon if you develop symptoms. If you test positive for Covid or have been in contact with anyone that has tested positive in the last 10 days please notify you surgeon.    [facility] - Preparing for Surgery Before surgery, you can play an  important role.  Because skin is not sterile, your skin needs to be as free of germs as possible.  You can reduce the number of germs on your skin by washing with CHG (chlorahexidine gluconate) soap before surgery.  CHG is an antiseptic cleaner which kills germs and bonds with the skin to continue killing germs even after washing. Please DO NOT use if you have an allergy to CHG or antibacterial soaps.  If your skin becomes reddened/irritated stop using the CHG and inform your nurse when you arrive at Short Stay. Do not shave (including legs and underarms) for at least 48 hours prior to the first CHG shower.  You may shave your face/neck.  Please follow these instructions carefully:  1.  Shower with CHG Soap the night before surgery and the  morning of surgery.  2.  If you choose to wash your hair, wash your hair first as usual with your normal  shampoo.  3.  After you shampoo, rinse your hair and body thoroughly to remove the shampoo.                             4.  Use CHG as you would any other liquid soap.  You can apply chg directly to the skin and wash.  Gently with a scrungie or clean washcloth.  5.  Apply the CHG Soap to your body ONLY FROM THE NECK DOWN.   Do   not use on face/ open  Wound or open sores. Avoid contact with eyes, ears mouth and   genitals (private parts).                       Wash face,  Genitals (private parts) with your normal soap.             6.  Wash thoroughly, paying special attention to the area where your    surgery  will be performed.  7.  Thoroughly rinse your body with warm water from the neck down.  8.  DO NOT shower/wash with your normal soap after using and rinsing off the CHG Soap.                9.  Pat yourself dry with a clean towel.            10.  Wear clean pajamas.            11.  Place clean sheets on your bed the night of your first shower and do not  sleep with pets. Day of Surgery : Do not apply any lotions/deodorants the  morning of surgery.  Please wear clean clothes to the hospital/surgery center.  FAILURE TO FOLLOW THESE INSTRUCTIONS MAY RESULT IN THE CANCELLATION OF YOUR SURGERY  PATIENT SIGNATURE_________________________________  NURSE SIGNATURE__________________________________  ________________________________________________________________________

## 2023-11-29 NOTE — Progress Notes (Signed)
 COVID Vaccine received:  []  No [x]  Yes Date of any COVID positive Test in last 90 days: July 2025 PCP - [facility] Dr. Piedad Cardiologist -   Chest x-ray - CT chest 10/16/23 Epic EKG -   Stress Test -  ECHO -  Cardiac Cath -   Bowel Prep - [x]  No  []   Yes ______  Pacemaker / ICD device [x]  No []  Yes   Spinal Cord Stimulator:[x]  No []  Yes       History of Sleep Apnea? [x]  No []  Yes   CPAP used?- [x]  No []  Yes    Does the patient monitor blood sugar?          [x]  No []  Yes  []  N/A  Patient has: [x]  NO Hx DM   []  Pre-DM                 []  DM1  []   DM2 Does patient have a [employer] or [employer]? []  No []  Yes   Fasting Blood Sugar Ranges-  Checks Blood Sugar _____ times a day  GLP1 agonist / usual dose - no GLP1 instructions:  SGLT-2 inhibitors / usual dose - no SGLT-2 instructions:   Blood Thinner / Instructions:no Aspirin Instructions:no  Comments:   Activity level: Patient is able to climb a flight of stairs without difficulty; [x]  No CP  [x]  No SOB,  Patient can perform ADLs without assistance.   Anesthesia review:   Patient denies shortness of breath, fever, cough and chest pain at PAT appointment.  Patient verbalized understanding and agreement to the Pre-Surgical Instructions that were given to them at this PAT appointment. Patient was also educated of the need to review these PAT instructions again prior to his/her surgery.I reviewed the appropriate phone numbers to call if they have any and questions or concerns.

## 2023-11-30 ENCOUNTER — Encounter (HOSPITAL_COMMUNITY)
Admission: RE | Admit: 2023-11-30 | Discharge: 2023-11-30 | Disposition: A | Discharge status: Home / Self Care | Source: Ambulatory Visit | Attending: Surgery | Admitting: Surgery

## 2023-11-30 ENCOUNTER — Other Ambulatory Visit: Payer: Self-pay

## 2023-11-30 VITALS — BP 113/70 | HR 81 | Temp 98.2°F | Resp 16 | Ht 64.0 in | Wt 160.0 lb

## 2023-11-30 DIAGNOSIS — Z01812 Encounter for preprocedural laboratory examination: Secondary | ICD-10-CM | POA: Insufficient documentation

## 2023-11-30 DIAGNOSIS — Z01818 Encounter for other preprocedural examination: Secondary | ICD-10-CM

## 2023-11-30 LAB — CBC
HCT: 38 % (ref 36.0–46.0)
Hemoglobin: 11.4 g/dL — ABNORMAL LOW (ref 12.0–15.0)
MCH: 26.6 pg (ref 26.0–34.0)
MCHC: 30 g/dL (ref 30.0–36.0)
MCV: 88.6 fL (ref 80.0–100.0)
Platelets: 273 K/uL (ref 150–400)
RBC: 4.29 MIL/uL (ref 3.87–5.11)
RDW: 13.2 % (ref 11.5–15.5)
WBC: 6.6 K/uL (ref 4.0–10.5)
nRBC: 0 % (ref 0.0–0.2)

## 2023-12-02 ENCOUNTER — Encounter (HOSPITAL_COMMUNITY): Payer: Self-pay | Admitting: Surgery

## 2023-12-02 NOTE — H&P (Signed)
 REFERRING PHYSICIAN: Trixie File, MD  PROVIDER: Haidy Kackley OZELL SPINNER, MD   Chief Complaint: New Consultation (Multinodular goiter)  History of Present Illness:  Patient is referred by Dr. File Trixie for surgical evaluation and management of an enlarging multinodular thyroid  goiter with early compressive symptoms. Patient was first diagnosed with multinodular goiter approximately 8 years ago. She has been followed with sequential ultrasound scanning. She has undergone previous fine-needle aspiration biopsies with benign cytopathology. Thyroid  function has remained remarkably stable with a TSH recently of 0.62. Patient has never been on thyroid  medication. Patient denies any previous head or neck surgery. There is no family history of thyroid  disease and specifically no history of thyroid  cancer. Patient underwent a recent ultrasound and early July 2025. This shows interval enlargement of her thyroid  gland with the right lobe measuring 11.8 cm and left lobe measuring 11.8 cm. No new or worrisome features were identified. Patient now presents to discuss thyroidectomy for management of multinodular goiter with continued increase in size and the development of mild early compressive symptoms. 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  Review of Systems: A complete review of systems was obtained from the patient. I have reviewed this information and discussed as appropriate with the patient. See HPI as well for other ROS.  Review of Systems  Constitutional: Negative.  HENT:  Mild globus type sensation  Eyes: Negative.  Respiratory: Negative.  Cardiovascular: Negative.  Gastrointestinal: Negative.  Genitourinary: Negative.  Musculoskeletal: Negative.  Skin: Negative.  Neurological: Negative.  Endo/Heme/Allergies: Negative.  Psychiatric/Behavioral: Negative.    Medical History: History reviewed. No pertinent past medical history.  Patient Active Problem List   Diagnosis  Goiter, nontoxic, multinodular   Past Surgical History:  Procedure Laterality Date  wisdom teeth removal 2021  polyps removed 2022    Allergies  Allergen Reactions  Latex Itching, Other (See Comments), Rash and Swelling  dryness   No current outpatient medications on file prior to visit.   No current facility-administered medications on file prior to visit.   History reviewed. No pertinent family history.   Social History   Tobacco Use  Smoking Status Never  Smokeless Tobacco Never    Social History   Socioeconomic History  Marital status: Married  Tobacco Use  Smoking status: Never  Smokeless tobacco: Never  Vaping Use  Vaping status: Unknown  Substance and Sexual Activity  Alcohol use: Never  Drug use: Never   Social Drivers of Corporate investment banker Strain: Low Risk (01/30/2019)  Received from [facility]  Overall Financial Resource Strain (CARDIA)  Difficulty of Paying Living Expenses: Not hard at all  Food Insecurity: No Food Insecurity (01/30/2019)  Received from [facility]  Hunger Vital Sign  Within the past 12 months, you worried that your food would run out before you got the money to buy more.: Never true  Within the past 12 months, the food you bought just didn't last and you didn't have money to get more.: Never true  Transportation Needs: No Transportation Needs (01/30/2019)  Received from [facility] - Transportation  Lack of Transportation (Medical): No  Lack of Transportation (Non-Medical): No  Physical Activity: Inactive (01/30/2019)  Received from [facility]  Exercise Vital Sign  On average, how many days per week do you engage in moderate to strenuous exercise (like a brisk walk)?: 0 days  On average, how many minutes do you engage in exercise at this level?: 0 min  Stress: No Stress Concern  Present (01/30/2019)  Received from [facility] - Occupational Stress  Questionnaire  Feeling of Stress : Not at all  Social Connections: Moderately Integrated (01/30/2019)  [facility]  Social Connection and Isolation Panel  In a typical week, how many times do you talk on the phone with family, friends, or neighbors?: More than three times a week  How often do you get together with friends or relatives?: More than three times a week  How often do you attend church or religious services?: More than 4 times per year  Do you belong to any clubs or organizations such as church groups, unions, fraternal or athletic groups, or school groups?: No  How often do you attend meetings of the clubs or organizations you belong to?: Never  Are you married, widowed, divorced, separated, never married, or living with a partner?: Married  Housing Stability: Unknown (10/09/2023)  Housing Stability Vital Sign  Homeless in the Last Year: No   Objective:   Vitals:  BP: 113/72  Pulse: (!) 114  Temp: 36.8 C (98.3 F)  SpO2: 99%  Weight: 70.4 kg (155 lb 3.2 oz)  Height: 162.6 cm (5' 4)  PainSc: 0-No pain   Body mass index is 26.64 kg/m.  Physical Exam   GENERAL APPEARANCE Comfortable, no acute issues Development: normal Gross deformities: none  SKIN Rash, lesions, ulcers: none Induration, erythema: none Nodules: none palpable  EYES Conjunctiva and lids: normal Pupils: equal  EARS, NOSE, MOUTH, THROAT External ears: no lesion or deformity External nose: no lesion or deformity Hearing: grossly normal  NECK Symmetric: no Trachea: Difficult to ascertain Thyroid : Markedly enlarged bilaterally, asymmetric, multilobulated, with multiple small and large nodules present. There is no tenderness. There is no associated lymphadenopathy. The gland extends down to the clavicles bilaterally. Full extent of the gland is difficult to ascertain on palpation.  CHEST/CV Not assessed  ABDOMEN Not assessed  GENITOURINARY/RECTAL Not  assessed  MUSCULOSKELETAL Station and gait: normal Digits and nails: no clubbing or cyanosis Muscle strength: grossly normal all extremities Deformity: none  LYMPHATIC Cervical: none palpable Supraclavicular: none palpable  PSYCHIATRIC Oriented to person, place, and time: yes Mood and affect: normal for situation Judgment and insight: appropriate for situation   Assessment and Plan:   Goiter, nontoxic, multinodular  Patient is referred by her endocrinologist, Dr. Lela Fendt, for surgical evaluation and recommendations regarding management of an enlarging multinodular thyroid  goiter with mild early compressive symptoms.  Patient provided with a copy of The Thyroid  Book: Medical and Surgical Treatment of Thyroid  Problems, published by [employer], 16 pages. Book reviewed and explained to patient during visit today.  Today we reviewed her clinical history. We reviewed her previous imaging studies. We reviewed the results of her cytopathology. We discussed options for management. At this point, total thyroidectomy would be the procedure of choice for management of a multinodular goiter of the size. We discussed the procedure at length. We discussed the size and location of the surgical incision. We discussed the risk and benefits of surgery including the risk of recurrent laryngeal nerve injury (approximately 5%) and the risk of injury to parathyroid glands. We discussed the hospital stay to be anticipated. We discussed her postoperative recovery and returned to work and activities. We discussed the need for lifelong thyroid  hormone replacement therapy. The patient understands and wishes to consider proceeding with surgery in the near future.  I would like to obtain a CT scan of the neck and chest  prior to making a final decision regarding thyroidectomy. It is difficult to tell from ultrasound examination and from physical examination the full extent of this thyroid  goiter. It is also  difficult to know the status of her airway. We will obtain a CT scan of the neck and chest with contrast. After I have reviewed those images, I will contact the patient and we will make a final decision regarding proceeding with surgery.  Krystal Spinner, MD [facility] practice Office: [phone]

## 2023-12-03 ENCOUNTER — Ambulatory Visit (HOSPITAL_COMMUNITY): Payer: Self-pay | Admitting: Physician Assistant

## 2023-12-03 ENCOUNTER — Encounter (HOSPITAL_COMMUNITY)
Admission: RE | Disposition: A | Discharge status: Home / Self Care | Payer: Self-pay | Source: Home / Self Care | Attending: Surgery

## 2023-12-03 ENCOUNTER — Other Ambulatory Visit: Payer: Self-pay

## 2023-12-03 ENCOUNTER — Encounter (HOSPITAL_COMMUNITY): Payer: Self-pay | Admitting: Surgery

## 2023-12-03 ENCOUNTER — Ambulatory Visit (HOSPITAL_BASED_OUTPATIENT_CLINIC_OR_DEPARTMENT_OTHER): Admitting: Anesthesiology

## 2023-12-03 ENCOUNTER — Ambulatory Visit (HOSPITAL_COMMUNITY)
Admission: RE | Admit: 2023-12-03 | Discharge: 2023-12-04 | Disposition: A | Discharge status: Home / Self Care | Attending: Surgery | Admitting: Surgery

## 2023-12-03 DIAGNOSIS — E042 Nontoxic multinodular goiter: Secondary | ICD-10-CM | POA: Diagnosis not present

## 2023-12-03 DIAGNOSIS — I10 Essential (primary) hypertension: Secondary | ICD-10-CM | POA: Insufficient documentation

## 2023-12-03 HISTORY — PX: THYROIDECTOMY: SHX17

## 2023-12-03 LAB — POCT PREGNANCY, URINE: Preg Test, Ur: NEGATIVE

## 2023-12-03 SURGERY — THYROIDECTOMY
Anesthesia: General

## 2023-12-03 MED ORDER — FENTANYL CITRATE PF 50 MCG/ML IJ SOSY: 25.0000 ug | PREFILLED_SYRINGE | INTRAMUSCULAR | Status: DC | PRN

## 2023-12-03 MED ORDER — ORAL CARE MOUTH RINSE: 15.0000 mL | OROMUCOSAL | Status: DC | PRN

## 2023-12-03 MED ORDER — MIDAZOLAM HCL 5 MG/5ML IJ SOLN
INTRAMUSCULAR | Status: DC | PRN
  Administered 2023-12-03: 2 mg via INTRAVENOUS

## 2023-12-03 MED ORDER — FENTANYL CITRATE (PF) 100 MCG/2ML IJ SOLN
INTRAMUSCULAR | Status: AC
  Filled 2023-12-03: qty 2

## 2023-12-03 MED ORDER — ACETAMINOPHEN 325 MG PO TABS
650.0000 mg | ORAL_TABLET | Freq: Four times a day (QID) | ORAL | Status: DC | PRN
  Administered 2023-12-03 – 2023-12-04 (×2): 650 mg via ORAL
  Filled 2023-12-03 (×2): qty 2

## 2023-12-03 MED ORDER — SODIUM CHLORIDE 0.45 % IV SOLN: INTRAVENOUS | Status: DC

## 2023-12-03 MED ORDER — ONDANSETRON HCL 4 MG/2ML IJ SOLN
INTRAMUSCULAR | Status: DC | PRN
Start: 2023-12-03 — End: 2023-12-03
  Administered 2023-12-03: 4 mg via INTRAVENOUS

## 2023-12-03 MED ORDER — ACETAMINOPHEN 650 MG RE SUPP: 650.0000 mg | Freq: Four times a day (QID) | RECTAL | Status: DC | PRN

## 2023-12-03 MED ORDER — ORAL CARE MOUTH RINSE: 15.0000 mL | Freq: Once | OROMUCOSAL | Status: AC

## 2023-12-03 MED ORDER — CHLORHEXIDINE GLUCONATE CLOTH 2 % EX PADS: 6.0000 | MEDICATED_PAD | Freq: Once | CUTANEOUS | Status: DC

## 2023-12-03 MED ORDER — CHLORHEXIDINE GLUCONATE 0.12 % MT SOLN
15.0000 mL | Freq: Once | OROMUCOSAL | Status: AC
  Administered 2023-12-03: 15 mL via OROMUCOSAL

## 2023-12-03 MED ORDER — ACETAMINOPHEN 10 MG/ML IV SOLN
INTRAVENOUS | Status: AC
  Filled 2023-12-03: qty 100

## 2023-12-03 MED ORDER — TRAMADOL HCL 50 MG PO TABS: 50.0000 mg | ORAL_TABLET | Freq: Four times a day (QID) | ORAL | Status: DC | PRN

## 2023-12-03 MED ORDER — SUGAMMADEX SODIUM 200 MG/2ML IV SOLN
INTRAVENOUS | Status: AC
  Filled 2023-12-03: qty 2

## 2023-12-03 MED ORDER — PROPOFOL 10 MG/ML IV BOLUS
INTRAVENOUS | Status: AC
  Filled 2023-12-03: qty 20

## 2023-12-03 MED ORDER — ONDANSETRON HCL 4 MG/2ML IJ SOLN
INTRAMUSCULAR | Status: AC
  Filled 2023-12-03: qty 2

## 2023-12-03 MED ORDER — ROCURONIUM BROMIDE 10 MG/ML (PF) SYRINGE
PREFILLED_SYRINGE | INTRAVENOUS | Status: DC | PRN
  Administered 2023-12-03: 70 mg via INTRAVENOUS
  Administered 2023-12-03: 20 mg via INTRAVENOUS

## 2023-12-03 MED ORDER — PHENYLEPHRINE 80 MCG/ML (10ML) SYRINGE FOR IV PUSH (FOR BLOOD PRESSURE SUPPORT)
PREFILLED_SYRINGE | INTRAVENOUS | Status: DC | PRN
  Administered 2023-12-03: 80 ug via INTRAVENOUS
  Administered 2023-12-03: 160 ug via INTRAVENOUS

## 2023-12-03 MED ORDER — LIDOCAINE HCL (CARDIAC) PF 100 MG/5ML IV SOSY
PREFILLED_SYRINGE | INTRAVENOUS | Status: DC | PRN
  Administered 2023-12-03: 100 mg via INTRAVENOUS

## 2023-12-03 MED ORDER — LIDOCAINE HCL (PF) 2 % IJ SOLN
INTRAMUSCULAR | Status: AC
  Filled 2023-12-03: qty 5

## 2023-12-03 MED ORDER — LACTATED RINGERS IV SOLN: INTRAVENOUS | Status: DC

## 2023-12-03 MED ORDER — DEXAMETHASONE SODIUM PHOSPHATE 10 MG/ML IJ SOLN
INTRAMUSCULAR | Status: DC | PRN
  Administered 2023-12-03: 10 mg via INTRAVENOUS

## 2023-12-03 MED ORDER — FENTANYL CITRATE (PF) 100 MCG/2ML IJ SOLN
INTRAMUSCULAR | Status: DC | PRN
  Administered 2023-12-03 (×6): 50 ug via INTRAVENOUS

## 2023-12-03 MED ORDER — SUGAMMADEX SODIUM 200 MG/2ML IV SOLN
INTRAVENOUS | Status: DC | PRN
  Administered 2023-12-03: 200 mg via INTRAVENOUS

## 2023-12-03 MED ORDER — CEFAZOLIN SODIUM-DEXTROSE 2-4 GM/100ML-% IV SOLN
2.0000 g | INTRAVENOUS | Status: AC
  Administered 2023-12-03: 2 g via INTRAVENOUS
  Filled 2023-12-03: qty 100

## 2023-12-03 MED ORDER — PROPOFOL 10 MG/ML IV BOLUS
INTRAVENOUS | Status: DC | PRN
  Administered 2023-12-03: 150 mg via INTRAVENOUS

## 2023-12-03 MED ORDER — CALCIUM CARBONATE 1250 (500 CA) MG PO TABS
2.0000 | ORAL_TABLET | Freq: Three times a day (TID) | ORAL | Status: DC
  Administered 2023-12-03 – 2023-12-04 (×2): 2500 mg via ORAL
  Filled 2023-12-03 (×2): qty 2

## 2023-12-03 MED ORDER — ONDANSETRON 4 MG PO TBDP: 4.0000 mg | ORAL_TABLET | Freq: Four times a day (QID) | ORAL | Status: DC | PRN

## 2023-12-03 MED ORDER — ONDANSETRON HCL 4 MG/2ML IJ SOLN: 4.0000 mg | Freq: Four times a day (QID) | INTRAMUSCULAR | Status: DC | PRN

## 2023-12-03 MED ORDER — 0.9 % SODIUM CHLORIDE (POUR BTL) OPTIME
TOPICAL | Status: DC | PRN
  Administered 2023-12-03: 1000 mL

## 2023-12-03 MED ORDER — ACETAMINOPHEN 10 MG/ML IV SOLN
1000.0000 mg | Freq: Once | INTRAVENOUS | Status: DC | PRN
  Administered 2023-12-03: 1000 mg via INTRAVENOUS

## 2023-12-03 MED ORDER — OXYCODONE HCL 5 MG PO TABS
5.0000 mg | ORAL_TABLET | ORAL | Status: DC | PRN
  Administered 2023-12-03: 5 mg via ORAL
  Filled 2023-12-03: qty 1

## 2023-12-03 MED ORDER — ONDANSETRON HCL 4 MG/2ML IJ SOLN
4.0000 mg | Freq: Once | INTRAMUSCULAR | Status: AC | PRN
  Administered 2023-12-03: 4 mg via INTRAVENOUS

## 2023-12-03 MED ORDER — ROCURONIUM BROMIDE 10 MG/ML (PF) SYRINGE
PREFILLED_SYRINGE | INTRAVENOUS | Status: AC
  Filled 2023-12-03: qty 10

## 2023-12-03 MED ORDER — OXYCODONE HCL 5 MG/5ML PO SOLN: 5.0000 mg | Freq: Once | ORAL | Status: DC | PRN

## 2023-12-03 MED ORDER — ONDANSETRON HCL 4 MG/2ML IJ SOLN
INTRAMUSCULAR | Status: AC
Start: 2023-12-03 — End: 2023-12-03
  Filled 2023-12-03: qty 2

## 2023-12-03 MED ORDER — HEMOSTATIC AGENTS (NO CHARGE) OPTIME
TOPICAL | Status: DC | PRN
  Administered 2023-12-03: 1 via TOPICAL

## 2023-12-03 MED ORDER — MIDAZOLAM HCL 2 MG/2ML IJ SOLN
INTRAMUSCULAR | Status: AC
  Filled 2023-12-03: qty 2

## 2023-12-03 MED ORDER — DEXAMETHASONE SODIUM PHOSPHATE 10 MG/ML IJ SOLN
INTRAMUSCULAR | Status: AC
Start: 2023-12-03 — End: 2023-12-03
  Filled 2023-12-03: qty 1

## 2023-12-03 MED ORDER — OXYCODONE HCL 5 MG PO TABS: 5.0000 mg | ORAL_TABLET | Freq: Once | ORAL | Status: DC | PRN

## 2023-12-03 MED ORDER — HYDROMORPHONE HCL 1 MG/ML IJ SOLN: 1.0000 mg | INTRAMUSCULAR | Status: DC | PRN

## 2023-12-03 SURGICAL SUPPLY — 27 items
BAG COUNTER SPONGE SURGICOUNT (BAG) ×1 IMPLANT
BLADE SURG 15 STRL LF DISP TIS (BLADE) ×1 IMPLANT
CHLORAPREP W/TINT 26 (MISCELLANEOUS) ×1 IMPLANT
CLIP TI MEDIUM 6 (CLIP) ×2 IMPLANT
CLIP TI WIDE RED SMALL 6 (CLIP) ×2 IMPLANT
COVER SURGICAL LIGHT HANDLE (MISCELLANEOUS) ×1 IMPLANT
DERMABOND ADVANCED .7 DNX12 (GAUZE/BANDAGES/DRESSINGS) ×1 IMPLANT
DRAPE LAPAROTOMY T 98X78 PEDS (DRAPES) ×1 IMPLANT
DRAPE UTILITY XL STRL (DRAPES) ×1 IMPLANT
ELECT PENCIL ROCKER SW 15FT (MISCELLANEOUS) ×1 IMPLANT
ELECT REM PT RETURN 15FT ADLT (MISCELLANEOUS) ×1 IMPLANT
GAUZE 4X4 16PLY ~~LOC~~+RFID DBL (SPONGE) ×1 IMPLANT
GLOVE SURG ORTHO 8.0 STRL STRW (GLOVE) ×1 IMPLANT
GOWN STRL REUS W/ TWL XL LVL3 (GOWN DISPOSABLE) ×2 IMPLANT
HEMOSTAT SURGICEL 2X4 FIBR (HEMOSTASIS) ×1 IMPLANT
ILLUMINATOR WAVEGUIDE N/F (MISCELLANEOUS) ×1 IMPLANT
KIT BASIN OR (CUSTOM PROCEDURE TRAY) ×1 IMPLANT
KIT TURNOVER KIT A (KITS) ×1 IMPLANT
PACK BASIC VI WITH GOWN DISP (CUSTOM PROCEDURE TRAY) ×1 IMPLANT
PAD MAGNETIC INSTR ST 16X20 (MISCELLANEOUS) ×1 IMPLANT
SHEARS HARMONIC 9CM CVD (BLADE) ×1 IMPLANT
SUT MNCRL AB 4-0 PS2 18 (SUTURE) ×1 IMPLANT
SUT SILK 3 0 SH 30 (SUTURE) ×1 IMPLANT
SUT VIC AB 3-0 SH 18 (SUTURE) ×2 IMPLANT
SYR BULB IRRIG 60ML STRL (SYRINGE) ×1 IMPLANT
TOWEL OR 17X26 10 PK STRL BLUE (TOWEL DISPOSABLE) ×1 IMPLANT
TUBING CONNECTING 10 (TUBING) ×1 IMPLANT

## 2023-12-03 NOTE — Anesthesia Preprocedure Evaluation (Addendum)
 Anesthesia Evaluation  Patient identified by MRN, date of birth, ID band Patient awake    Reviewed: Allergy & Precautions, NPO status , Patient's Chart, lab work & pertinent test results, reviewed documented beta blocker date and time   History of Anesthesia Complications Negative for: history of anesthetic complications  Airway Mallampati: IV   Neck ROM: Full  Mouth opening: Limited Mouth Opening  Dental no notable dental hx.    Pulmonary neg COPD Thyroid : Extremely bulky, heterogeneous thyroid  goiter. Left lobe measures 9.5 x 5.7 x 5.9 cm. Right lobe measures 10.3 x 5.0 x 5.5 cm (series 17, image 49, series 11, image 71). No significant substernal component.      breath sounds clear to auscultation       Cardiovascular hypertension, (-) angina (-) CAD  Rhythm:Regular Rate:Normal     Neuro/Psych neg Seizures PSYCHIATRIC DISORDERS         GI/Hepatic ,,,(+) neg Cirrhosis        Endo/Other  Multinodular goiter  Renal/GU Renal disease     Musculoskeletal   Abdominal   Peds  Hematology  (+) Blood dyscrasia, anemia   Anesthesia Other Findings   Reproductive/Obstetrics                              Anesthesia Physical Anesthesia Plan  ASA: 2  Anesthesia Plan: General   Post-op Pain Management:    Induction: Intravenous  PONV Risk Score and Plan: 2 and Ondansetron  and Dexamethasone   Airway Management Planned: Oral ETT and Video Laryngoscope Planned  Additional Equipment:   Intra-op Plan:   Post-operative Plan: Extubation in OR  Informed Consent: I have reviewed the patients History and Physical, chart, labs and discussed the procedure including the risks, benefits and alternatives for the proposed anesthesia with the patient or authorized representative who has indicated his/her understanding and acceptance.     Dental advisory given  Plan Discussed with:  CRNA  Anesthesia Plan Comments:         Anesthesia Quick Evaluation

## 2023-12-03 NOTE — Interval H&P Note (Signed)
 History and Physical Interval Note:  12/03/2023 9:11 AM  Meghan Jackson  has presented today for surgery, with the diagnosis of MULTINODULAR THYROID  GOITER.  The various methods of treatment have been discussed with the patient and family. After consideration of risks, benefits and other options for treatment, the patient has consented to   Procedure(s): THYROIDECTOMY (N/A) as a surgical intervention.    The patient's history has been reviewed, patient examined, no change in status, stable for surgery.  I have reviewed the patient's chart and labs.  Questions were answered to the patient's satisfaction.    Krystal Spinner, MD [facility] practice Office: [phone]   Krystal Spinner

## 2023-12-03 NOTE — Op Note (Signed)
 Procedure Note  Pre-operative Diagnosis:  Multinodular thyroid  goiter  Post-operative Diagnosis:  same  Surgeon:  Krystal Spinner, MD  Assistant:  Tonja Shaper, PA-C   Procedure:  Total thyroidectomy  Anesthesia:  General  Estimated Blood Loss:  100 cc  Drains: none         Specimen: thyroid  to pathology  Indications:  Patient is referred by Dr. Lela Fendt for surgical evaluation and management of an enlarging multinodular thyroid  goiter with early compressive symptoms. Patient was first diagnosed with multinodular goiter approximately 8 years ago. She has been followed with sequential ultrasound scanning. She has undergone previous fine-needle aspiration biopsies with benign cytopathology. Thyroid  function has remained remarkably stable with a TSH recently of 0.62. Patient has never been on thyroid  medication. Patient denies any previous head or neck surgery. There is no family history of thyroid  disease and specifically no history of thyroid  cancer. Patient underwent a recent ultrasound and early July 2025. This shows interval enlargement of her thyroid  gland with the right lobe measuring 11.8 cm and left lobe measuring 11.8 cm. No new or worrisome features were identified. Patient now presents to discuss thyroidectomy for management of multinodular goiter with continued increase in size and the development of mild early compressive symptoms.   Procedure Details: Procedure was done in OR #1 at the [facility]. The patient was brought to the operating room and placed in a supine position on the operating room table. Following administration of general anesthesia, the patient was positioned and then prepped and draped in the usual aseptic fashion. After ascertaining that an adequate level of anesthesia had been achieved, a small Kocher incision was made with #15 blade. Dissection was carried through subcutaneous tissues and platysma.Hemostasis was achieved with the electrocautery.  Skin flaps were elevated cephalad and caudad from the thyroid  notch to the sternal notch. A Mahorner self-retaining retractor was placed for exposure. Strap muscles were incised in the midline and dissection was begun on the left side.  Strap muscles were reflected laterally.  Left thyroid  lobe was markedly enlarged, soft, and nodular.  The left lobe was gently mobilized with blunt dissection. Superior pole vessels were dissected out and divided individually between small and medium ligaclips with the harmonic scalpel. The thyroid  lobe was rolled anteriorly. Branches of the inferior thyroid  artery were divided between small ligaclips with the harmonic scalpel. Inferior venous tributaries were divided between ligaclips. Both the superior and inferior parathyroid glands were identified and preserved on their vascular pedicles. The recurrent laryngeal nerve was identified and preserved along its course. The ligament of Court was released with the electrocautery and the gland was mobilized onto the anterior trachea. Isthmus was mobilized across the midline. There was moderate sized pyramidal lobe present which was resected off of the thyroid  cartilage and resected with the isthmus. Dry pack was placed in the left neck.  The right thyroid  lobe was gently mobilized with blunt dissection. Right thyroid  lobe was markedly enlarged, soft, and nodular but slightly smaller in size than the left lobe. Superior pole vessels were dissected out and divided between small and medium ligaclips with the Harmonic scalpel. Superior parathyroid was identified and preserved. Inferior venous tributaries were divided between medium ligaclips with the harmonic scalpel. The right thyroid  lobe was rolled anteriorly and the branches of the inferior thyroid  artery divided between small ligaclips. The right recurrent laryngeal nerve was identified and preserved along its course. The ligament of Court was released with the electrocautery. The  right thyroid  lobe was  mobilized onto the anterior trachea and the remainder of the thyroid  was dissected off the anterior trachea and the thyroid  was completely excised. A suture was used to mark the left lobe. The entire thyroid  gland was submitted to pathology for review.  Palpation of the operative field demonstrated no evidence of residual disease and no abnormal lymph nodes.  The neck was irrigated with warm saline. Fibrillar was placed throughout the operative field. Strap muscles were approximated in the midline with interrupted 3-0 Vicryl sutures. Platysma was closed with interrupted 3-0 Vicryl sutures. Skin was closed with a running 4-0 Monocryl subcuticular suture. Wound was washed and Dermabond was applied. The patient was awakened from anesthesia and brought to the recovery room. The patient tolerated the procedure well.   Krystal Spinner, MD [facility] Surgery Office: [phone]

## 2023-12-03 NOTE — Transfer of Care (Signed)
 Immediate Anesthesia Transfer of Care Note  Patient: Meghan Jackson  Procedure(s) Performed: THYROIDECTOMY  Patient Location: PACU  Anesthesia Type:General  Level of Consciousness: sedated  Airway & Oxygen Therapy: Patient Spontanous Breathing and Patient connected to face mask oxygen  Post-op Assessment: Report given to RN and Post -op Vital signs reviewed and stable  Post vital signs: Reviewed and stable  Last Vitals:  Vitals Value Taken Time  BP 131/85 12/03/23 12:09  Temp    Pulse 87 12/03/23 12:12  Resp 18 12/03/23 12:12  SpO2 95 % 12/03/23 12:12  Vitals shown include unfiled device data.  Last Pain:  Vitals:   12/03/23 0843  TempSrc:   PainSc: 0-No pain      Patients Stated Pain Goal: 5 (12/03/23 0843)  Complications: No notable events documented.

## 2023-12-03 NOTE — Anesthesia Procedure Notes (Signed)
 Procedure Name: Intubation Date/Time: 12/03/2023 9:42 AM  Performed by: Carleton Garnette SAUNDERS, CRNAPre-anesthesia Checklist: Patient identified, Emergency Drugs available, Suction available, Patient being monitored and Timeout performed Patient Re-evaluated:Patient Re-evaluated prior to induction Oxygen Delivery Method: Circle system utilized Preoxygenation: Pre-oxygenation with 100% oxygen Induction Type: IV induction Ventilation: Mask ventilation without difficulty Laryngoscope Size: Mac, Glidescope and 3 Grade View: Grade I Tube type: Oral Tube size: 7.0 mm Number of attempts: 1 Airway Equipment and Method: Stylet Placement Confirmation: ETT inserted through vocal cords under direct vision, positive ETCO2 and breath sounds checked- equal and bilateral Secured at: 22 cm Tube secured with: Tape Dental Injury: Teeth and Oropharynx as per pre-operative assessment

## 2023-12-03 NOTE — Anesthesia Postprocedure Evaluation (Signed)
 Anesthesia Post Note  Patient: Meghan Jackson  Procedure(s) Performed: THYROIDECTOMY     Patient location during evaluation: PACU Anesthesia Type: General Level of consciousness: awake and alert Pain management: pain level controlled Vital Signs Assessment: post-procedure vital signs reviewed and stable Respiratory status: spontaneous breathing, nonlabored ventilation, respiratory function stable and patient connected to nasal cannula oxygen Cardiovascular status: blood pressure returned to baseline and stable Postop Assessment: no apparent nausea or vomiting Anesthetic complications: no   No notable events documented.  Last Vitals:  Vitals:   12/03/23 1417 12/03/23 1511  BP: 135/80 136/82  Pulse: 72 77  Resp: 18 16  Temp: 36.4 C 36.5 C  SpO2: 100% 100%    Last Pain:  Vitals:   12/03/23 1511  TempSrc: Oral  PainSc:                  [place]wood MARLA Cornea

## 2023-12-03 NOTE — Plan of Care (Signed)

## 2023-12-04 ENCOUNTER — Encounter (HOSPITAL_COMMUNITY): Payer: Self-pay | Admitting: Surgery

## 2023-12-04 DIAGNOSIS — E042 Nontoxic multinodular goiter: Secondary | ICD-10-CM | POA: Diagnosis not present

## 2023-12-04 LAB — CALCIUM: Calcium: 9.1 mg/dL (ref 8.9–10.3)

## 2023-12-04 MED ORDER — LEVOTHYROXINE SODIUM 100 MCG PO TABS: 100.0000 ug | ORAL_TABLET | Freq: Every day | ORAL | 2 refills | Status: DC

## 2023-12-04 MED ORDER — OXYCODONE-ACETAMINOPHEN 5-325 MG PO TABS: 1.0000 | ORAL_TABLET | Freq: Four times a day (QID) | ORAL | 0 refills | Status: AC | PRN

## 2023-12-04 MED ORDER — CALCIUM CARBONATE ANTACID 500 MG PO CHEW: 2.0000 | CHEWABLE_TABLET | Freq: Two times a day (BID) | ORAL | 1 refills | Status: AC

## 2023-12-04 NOTE — Discharge Instructions (Signed)

## 2023-12-04 NOTE — Progress Notes (Signed)
   12/04/23 0945  TOC Brief Assessment  Insurance and Status Reviewed  Patient has primary care physician Yes  Home environment has been reviewed resides in a private residence  Prior level of function: Independent  Prior/Current Home Services No current home services  Social Drivers of Health Review SDOH reviewed no interventions necessary  Readmission risk has been reviewed Yes  Transition of care needs no transition of care needs at this time

## 2023-12-04 NOTE — Plan of Care (Signed)

## 2023-12-04 NOTE — Discharge Summary (Signed)
    Physician Discharge Summary   Patient ID: Meghan Jackson MRN: [id number] DOB/AGE: 05-04-1980 43 y.o.  Admit date: 12/03/2023  Discharge date: 12/04/2023  Discharge Diagnoses:  Principal Problem:   Multinodular goiter (nontoxic)   Discharged Condition: good  Hospital Course: Patient was admitted for observation following total thyroidectomy.  Post op course was uncomplicated.  Pain was well controlled.  Tolerated diet.  Post op calcium  level on morning following surgery was 9.1 mg/dl.  Patient was prepared for discharge home on POD#1.  Consults: None  Treatments: surgery: total thyroidectomy  Discharge Exam: Blood pressure 105/65, pulse 83, temperature 98.2 F (36.8 C), temperature source Oral, resp. rate 17, height 5' 4 (1.626 m), weight 72.6 kg, last menstrual period 11/05/2023, SpO2 100%, not currently breastfeeding. HEENT - clear Neck - wound dry and intact with Dermabond; mild STS; voice mildly hoarse without stridor  Disposition: Home  Discharge Instructions     Diet - low sodium heart healthy   Complete by: As directed    Increase activity slowly   Complete by: As directed    No dressing needed   Complete by: As directed       Allergies as of 12/04/2023       Reactions   Latex Itching, Other (See Comments)   dryness        Medication List     TAKE these medications    calcium  carbonate 500 MG chewable tablet Commonly known as: Tums Chew 2 tablets (400 mg of elemental calcium  total) by mouth 2 (two) times daily.   fluticasone 50 MCG/ACT nasal spray Commonly known as: FLONASE Place 2 sprays into both nostrils daily as needed for allergies or rhinitis.   hydrocortisone  cream 1 % Apply 1 Application topically daily as needed for itching.   levothyroxine  100 MCG tablet Commonly known as: Synthroid  Take 1 tablet (100 mcg total) by mouth daily before breakfast.   oxyCODONE -acetaminophen  5-325 MG tablet Commonly known as: Percocet Take 1-2  tablets by mouth every 6 (six) hours as needed for moderate pain (pain score 4-6).               Discharge Care Instructions  (From admission, onward)           Start     Ordered   12/04/23 0000  No dressing needed        12/04/23 9145            Follow-up Information     Meghan Boas, MD. Schedule an appointment as soon as possible for a visit in 3 day(s).   Specialty: General Surgery Why: For wound re-check Contact information: [address] [place] [address] [phone]                 Jackson Eletha, MD [facility] Office: [phone]   Signed: Boas Meghan 12/04/2023, 8:55 AM

## 2023-12-05 ENCOUNTER — Ambulatory Visit: Payer: Self-pay | Admitting: Surgery

## 2023-12-05 LAB — SURGICAL PATHOLOGY

## 2023-12-05 NOTE — Progress Notes (Signed)
 Pathology is benign, as expected.  Darnell Level, MD [facility] practice Office: [phone]

## 2023-12-26 ENCOUNTER — Encounter: Payer: Self-pay | Admitting: Internal Medicine

## 2023-12-26 ENCOUNTER — Ambulatory Visit (INDEPENDENT_AMBULATORY_CARE_PROVIDER_SITE_OTHER): Payer: BC Managed Care – PPO | Admitting: Internal Medicine

## 2023-12-26 ENCOUNTER — Other Ambulatory Visit

## 2023-12-26 VITALS — BP 118/70 | HR 79 | Ht 64.0 in | Wt 171.0 lb

## 2023-12-26 DIAGNOSIS — Z8639 Personal history of other endocrine, nutritional and metabolic disease: Secondary | ICD-10-CM | POA: Diagnosis not present

## 2023-12-26 DIAGNOSIS — E559 Vitamin D deficiency, unspecified: Secondary | ICD-10-CM

## 2023-12-26 DIAGNOSIS — E890 Postprocedural hypothyroidism: Secondary | ICD-10-CM | POA: Insufficient documentation

## 2023-12-26 NOTE — Progress Notes (Addendum)
 Patient ID: Meghan Jackson, female   DOB: 04/21/1980, 43 y.o.   MRN: [id number]   HPI  Meghan Jackson is a 43 y.o.-year-old female, presenting for follow-up for thyroid  nodules and also for history of elevated T3 and low free T4.  Last visi 3 months ago (virtual).  Interim history: 3 months ago she had thyroidectomy by Dr. Eletha.  She is feeling well after the surgery, with slightly more fatigue, but no other complaints.  She did have constipation right after the surgery which resolved. No neck pressure, pain with swallowing, or other neck compression symptoms after the surgery.  Reviewed history: Patient has a history of 2 miscarriages and also having infertility treatments with Dr. Barbette.  During investigation for the above problems, TSH was found to be normal but her T3 was elevated and she was referred back to endocrinology at that time.  In the months prior to our last appointment she had a normal TSH and a slightly low free T4.  The free T4 remains slightly low in 12/2018.  She also has a history of thyroid  nodules that were discovered incidentally during investigation for infertility. At that time, on palpation, the thyroid  was felt to be enlarged.  The thyroid  ultrasound showed 2 dominant nodules, which were biopsied with benign results.    She had another thyroid  ultrasound in 2019 that showed that the 2 nodules could have been areas of inflammation (pseudonodules).  Reviewed and addended her imaging test report and biopsy results: Thyroid  U/S (01/05/2016): Isthmic 1.1 cm isoechoic nodule Right mid 2.8 x 1.3 x 2.1 cm solid isoechoic nodule; few other small scattered cystic areas including a 0.6 cm predominately cystic nodule in the superior right lobe and a 0.5 cm partially cystic nodule in the inferior right lobe which are clearly low suspicion. Left superior 3.1 x 1.9 x 3 cm solid, isoechoic, nodule  FNA of both dominant nodules (01/21/2016): Benign  Thyroid  U/S  (04/19/2017): Parenchymal Echotexture: Markedly heterogenous Isthmus: Enlarged measuring 0.6 cm in diameter, unchanged Right lobe: Enlarged measuring 7.2 x 3.2 x 3.8 cm, unchanged, previously, 6.9 x 3.3 x 3.9 cm Left lobe: Enlarged measuring 8.0 x 2.9 x 3.6 cm, unchanged, previously, 6.8 x 3.0 x 3.7 cm __________________________________________  Nodule # 1: Prior biopsy: No Location: Isthmus; Mid Maximum size: 1.1 cm; Other 2 dimensions: 1.1 x 0.9 cm, previously, 1.1 x 1.1 x 0.9 cm Composition: solid/almost completely solid (2) Echogenicity: isoechoic (1)  Given size (<1.4 cm) and appearance, this nodule does NOT meet TI-RADS criteria for biopsy or dedicated follow-up. _________________________________________________________  There is a punctate (approximately 0.6 cm) hypoechoic nodule within the superior pole the right lobe of the thyroid  which does not meet imaging criteria to recommend percutaneous sampling or dedicated follow-up  Previously biopsied ill-defined approximately 2.8 cm apparent nodule within the mid aspect of the right lobe of the thyroid  is less conspicuous on the present examination and thus is favored to have represented a pseudo nodule. Correlation with prior biopsy results is recommended.  Previously biopsied 3.1 cm nodule/mass within the superior pole of the left lobe of the thyroid  is less conspicuous on the present examination and thus favored to have represented a pseudo nodule. Correlation prior biopsy results is recommended.  IMPRESSION: 1. Similar findings of multinodular goiter. No definitive new or enlarging thyroid  nodules. 2. Previously biopsied bilateral thyroid  nodules are less conspicuous on the present examination and may have represented pseudo nodules. Correlation with prior biopsy results is recommended. Assuming benign pathologic  diagnosis, repeat sampling and/or continued dedicated follow-up is not recommended.  Thyroid  U/S  (11/12/2020): Stable nodules, but thyroid  gland was enlarged Parenchymal Echotexture: Mildly heterogenous Isthmus: Enlarged measuring 1.2 cm in diameter  Right lobe: Enlarged measuring 7.7 x 3.0 x 4.5 cm, previously, 7.2 x 3.2 x 3.8 cm  Left lobe: Enlarged measuring 8.8 x 3.9 x 4.6 cm, previously, 8.0 x 2.9 x 3.6 cm  _________________________________________________________   Previously questioned 1.1 cm isoechoic ill-defined nodule within the thyroid  isthmus is not definitely seen on the present examination and thus favored to have represented a pseudonodule.  _________________________________________________________   The previously biopsied approximately 2.6 x 2.5 x 1.9 cm partially cystic though predominantly solid isoechoic ill-defined nodule within the mid aspect the right lobe of the thyroid  (labeled 1), is grossly unchanged compared to the 12/2015 examination, previously, 2.8 cm in diameter. Correlation with previous biopsy results is advised.  _________________________________________________________   Previously biopsied approximately 4.1 x 2.9 x 2.3 cm partially cystic though predominantly solid isoechoic ill-defined nodule within the mid aspect the left lobe of the thyroid  (labeled 2) has minimally increased in size compared to the 12/2015 examination, previously, 3.2 cm, with slight size differences likely attributable to a combination of interval partial cystic degeneration (a typically benign finding) as well as accentuated by the ill-defined borders of the nodule. Correlation with previous biopsy results is advised.   IMPRESSION: 1. Similar findings of thyromegaly and multinodular goiter. No worrisome new or enlarging thyroid  nodules. 2. Previously biopsied bilateral thyroid  nodules appear grossly unchanged compared to the 12/2015 examination. Correlation with previous biopsy results is advised. Additionally, relative imaging stability for the past 5 years is  indicative of a benign etiology.  Thyroid  U/S (09/13/2023): Parenchymal Echotexture: Moderately heterogeneous  Isthmus: 1.5 cm ,previously 1.2 cm  Right lobe: 11.8 x 4.4 x 4.4 cm ,previously 7.7 x 3.0 x 4.5 cm  Left lobe: 11.8 x 5.2 x 6.1 cm ,previously 8.8 x 3.9 x 4.6 cm  ________________________________________________________   Estimated total number of nodules >/= 1 cm: 2 ________________________________________________________   Similar benign appearing appearing solid cystic nodule right mid thyroid  measuring up to 3.8 cm. _________________________________________________________   Similar appearance of previously biopsied solid cystic nodule in the right inferior thyroid  (labeled 2, 3.0 cm, previously 2.6 cm).   No cervical lymphadenopathy.   IMPRESSION: Similar appearing multinodular goiter.  She had total thyroidectomy by Dr. Eletha 12/03/2023.  Pathology was benign.  Pt denies: - hoarseness - dysphagia - choking - SOB with lying down  She was started on levothyroxine  100 mcg daily in the hospital.  She takes this:  - in am - fasting - at least 30 min from b'fast - + calcium  (Tums) - 30-45 min later - no iron - no multivitamins - no PPIs - not on Biotin  Reviewed her TFTs: Lab Results  Component Value Date   TSH 0.62 12/26/2022   TSH 0.81 12/26/2021   TSH 0.69 12/22/2020   TSH 0.63 12/23/2019   TSH 0.65 03/27/2019   TSH 0.67 09/19/2018   TSH 1.00 04/05/2017   TSH 0.401 07/07/2013   FREET4 0.66 12/26/2022   FREET4 0.75 12/26/2021   FREET4 0.65 12/22/2020   FREET4 0.64 12/23/2019   FREET4 0.86 03/27/2019   FREET4 0.55 (L) 09/19/2018   FREET4 0.81 04/05/2017   Lab Results  Component Value Date   T3FREE 3.2 12/26/2022   T3FREE 4.2 12/26/2021   T3FREE 3.3 12/22/2020   T3FREE 3.9 12/23/2019   T3FREE 3.6 03/27/2019  T3FREE 3.6 09/19/2018   T3FREE 4.1 04/05/2017  12/26/2018: TSH 1.24, Free T4 0.70 (0.82-1.77) 08/29/2018: TSH 0.57 12/25/2015: TSH  0.794, total T4 6.7  Her thyroid  antibodies were not elevated: Component     Latest Ref Rng & Units 09/19/2018  Thyroperoxidase Ab SerPl-aCnc     <9 IU/mL 1  Thyroglobulin Ab     < or = 1 IU/mL <1  TSI     <140 % baseline <89  12/25/2015: TPO antibodies 15 (0-34)  No FH of thyroid  disease or thyroid  cancer. No h/o radiation tx to head or neck. No herbal supplements. No Biotin use. No recent steroids use.   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  No results found for: VD25OH  ROS: + See HPI  I reviewed pt's medications, allergies, PMH, social hx, family hx, and changes were documented in the history of present illness. Otherwise, unchanged from my initial visit note.  Past Medical History:  Diagnosis Date   Anal fissure    Hemorrhoids    Irregular heart rhythm    Past Surgical History:  Procedure Laterality Date   POLYPECTOMY     THYROIDECTOMY N/A 12/03/2023   Procedure: THYROIDECTOMY;  Surgeon: Eletha Boas, MD;  Location: [facility];  Service: General;  Laterality: N/A;   WISDOM TOOTH EXTRACTION     Social History   Social History   Marital status: Married     Spouse name: N/A   Number of children: 0   Occupational History   Internal trainer   Social History Main Topics   Smoking status: Never Smoker   Smokeless tobacco: Never Used   Alcohol use     1 Glasses of wine Every 2 months      Comment: occ   Drug use: No   Sexual activity: Yes   Current Outpatient Medications on File Prior to Visit  Medication Sig Dispense Refill   calcium  carbonate (TUMS) 500 MG chewable tablet Chew 2 tablets (400 mg of elemental calcium  total) by mouth 2 (two) times daily. 90 tablet 1   fluticasone (FLONASE) 50 MCG/ACT nasal spray Place 2 sprays into both nostrils daily as needed for allergies or rhinitis.     hydrocortisone  cream 1 % Apply 1 Application topically daily as needed for itching.     levothyroxine  (SYNTHROID ) 100 MCG tablet Take 1 tablet (100 mcg total) by  mouth daily before breakfast. 30 tablet 2   oxyCODONE -acetaminophen  (PERCOCET) 5-325 MG tablet Take 1-2 tablets by mouth every 6 (six) hours as needed for moderate pain (pain score 4-6). 15 tablet 0   No current facility-administered medications on file prior to visit.   Allergies  Allergen Reactions   Latex Itching and Other (See Comments)    dryness   Family History  Problem Relation Age of Onset   Kidney disease Father    Cancer Maternal Grandmother    PE: BP 118/70   Pulse 79   Ht 5' 4 (1.626 m)   Wt 171 lb (77.6 kg)   LMP 11/05/2023 (Approximate) Comment: POC pregnancy test (-) negative on 12/03/2023  SpO2 99%   BMI 29.35 kg/m  Wt Readings from Last 3 Encounters:  12/26/23 171 lb (77.6 kg)  12/03/23 160 lb (72.6 kg)  11/30/23 160 lb (72.6 kg)   Constitutional: overweight, in NAD Eyes:  EOMI, no exophthalmos ENT: no neck masses, thyroidectomy scar with slight swelling, but no fluctuance or pain on palpation, no cervical lymphadenopathy Cardiovascular: RRR, No MRG Respiratory: CTA B  Musculoskeletal: no deformities Skin:no rashes Neurological: no tremor with outstretched hands  ASSESSMENT: 1.  History of large multinodular goiter  2. Abnormal free thyroid  hormones   PLAN: 1.  History of large multinodular goiter -Patient with a history of a large goiter with several thyroid  nodules on ultrasound.  The dominant nodules were not larger on the ultrasound from 11/2020.  They were stable and isoechoic, without microcalcifications, and importantly, appearing more like pseudo nodules the irregular nodules, which point towards size of inflammation. We repeated the thyroid  ultrasound on 09/13/2023 and this showed stable appearance of her nodules, but the thyroid  size actually increased bilaterally from 7.7 cm (right lobe) and 8.8 cm (left lobe) to 11.8 cm for both lobes.  Of note, only 2 nodules were more clearly discerned, measuring 3.8 and 3.0 cm respectively. -She had  increasing neck compression symptoms especially in the 2 months prior to our last visit.  We did discuss about the possibility of radiofrequency ablation but she had many, large, nodules, so I did not recommend this.  Since last visit, she had total thyroidectomy with Dr. Eletha on 12/03/2023.  She is feeling well after the procedure without neck compression symptoms and dysesthesia at the surgical site.  She was started on levothyroxine  in the hospital before discharge.  She was also started on 2 tablets of Tums which she takes in the morning. - She has slight swelling at the surgical site, as expected, less than a month after surgery.  We discussed about adding cocoa butter, and can alternate with Mederma cream. - She has another appointment with Dr. Eletha coming up in approximately 1 week - Will check her calcium  and vitamin D level today to see if we can start tapering the calcium  supplement to off  2.  Postsurgical hypothyroidism -She has a history of elevated T3 and also slightly low free T4 during pregnancy.  However, no intervention was needed for this as I explained that this could have been related to pregnancy related thyroiditis variability, not necessarily a pathologic finding.  Also, her previous antithyroid antibodies were not elevated to point towards a diagnosis of Hashimoto's thyroiditis. -She is now status post thyroidectomy as mentioned above and on levothyroxine . - Patient has a history of elevated T3 and also slightly low free T4 during pregnancy.  However, no intervention was needed for this as I explained that this could have been related to pregnancy and/or as a variability, but not necessarily a pathologic finding - latest thyroid  labs reviewed with pt. >> normal, but this was before her surgery  Lab Results  Component Value Date   TSH 0.62 12/26/2022  - she continues on LT4 100 mcg daily - pt feels good on this dose. - we discussed about taking the thyroid  hormone every day,  with water, >30 minutes before breakfast, separated by >4 hours from acid reflux medications, calcium , iron, multivitamins. Pt. is taking it incorrectly, with calcium  taken too close to levothyroxine , only 30 to 45 minutes later.  For now, I advised her to move the calcium  4 hours later but we will check a calcium  level today and I am hoping that we can start decreasing the supplement and eventually stop.  I will also check a vitamin D level today. - will check thyroid  tests today: TSH and fT4 - If labs are abnormal, she will need to return for repeat TFTs in 1.5 months - Otherwise, we will see her back in 6 months  Component     Latest  Ref Rng 12/26/2023  T4,Free(Direct)     0.8 - 1.8 ng/dL 1.5   TSH     mIU/L 4.92 (H)   Calcium  Ionized     4.7 - 5.5 mg/dL 5.3   Vitamin D, 25-Hydroxy     30 - 100 ng/mL 18 (L)   Calcium  is normal, vitamin D level is low.  I would recommend 2000 units vitamin D daily. Will decrease her calcium  supplement to only 1 tablet a day, taken in the evening. TSH is slightly elevated.  I would like to repeat this in 1.5-2 months after she stops the morning calcium .  We will also repeat a vitamin D level and ionized calcium  then.  Lela Fendt, MD PhD [facility] Endocrinology

## 2023-12-26 NOTE — Patient Instructions (Addendum)
 Please continue Levothyroxine  100 mcg daily.  Take the thyroid  hormone every day, with water, at least 30 minutes before breakfast, separated by at least 4 hours from: - acid reflux medications - calcium  - iron - multivitamins  Please stop at the lab.  Please return for a follow-up appointment in 6 months

## 2023-12-27 LAB — VITAMIN D 25 HYDROXY (VIT D DEFICIENCY, FRACTURES): Vit D, 25-Hydroxy: 18 ng/mL — ABNORMAL LOW (ref 30–100)

## 2023-12-27 LAB — T4, FREE: Free T4: 1.5 ng/dL (ref 0.8–1.8)

## 2023-12-27 LAB — TSH: TSH: 5.07 m[IU]/L — ABNORMAL HIGH

## 2023-12-27 LAB — CALCIUM, IONIZED: Calcium, Ion: 5.3 mg/dL (ref 4.7–5.5)

## 2023-12-28 ENCOUNTER — Ambulatory Visit: Payer: Self-pay | Admitting: Internal Medicine

## 2023-12-28 MED ORDER — LEVOTHYROXINE SODIUM 100 MCG PO TABS: 100.0000 ug | ORAL_TABLET | Freq: Every day | ORAL | 3 refills | Status: AC

## 2023-12-28 NOTE — Addendum Note (Signed)
 Addended by: TRIXIE FILE on: 12/28/2023 11:42 AM   Modules accepted: Orders

## 2024-03-11 ENCOUNTER — Other Ambulatory Visit

## 2024-03-12 LAB — VITAMIN D 25 HYDROXY (VIT D DEFICIENCY, FRACTURES): Vit D, 25-Hydroxy: 35 ng/mL (ref 30–100)

## 2024-03-12 LAB — T4, FREE: Free T4: 1.5 ng/dL (ref 0.8–1.8)

## 2024-03-12 LAB — CALCIUM, IONIZED: Calcium, Ion: 5.2 mg/dL (ref 4.7–5.5)

## 2024-03-12 LAB — TSH: TSH: 3.59 m[IU]/L

## 2024-06-25 ENCOUNTER — Ambulatory Visit: Admitting: Internal Medicine
